# Patient Record
Sex: Male | Born: 1950 | Race: Black or African American | Hispanic: No | Marital: Married | Smoking: Never smoker
Health system: Southern US, Community
[De-identification: ages and names within clinical notes are randomized; demographics above are authoritative.]

## PROBLEM LIST (undated history)

## (undated) DIAGNOSIS — S335XXA Sprain of ligaments of lumbar spine, initial encounter: Secondary | ICD-10-CM

## (undated) DIAGNOSIS — E785 Hyperlipidemia, unspecified: Secondary | ICD-10-CM

## (undated) DIAGNOSIS — M7660 Achilles tendinitis, unspecified leg: Secondary | ICD-10-CM

## (undated) DIAGNOSIS — I10 Essential (primary) hypertension: Secondary | ICD-10-CM

## (undated) DIAGNOSIS — M545 Low back pain, unspecified: Secondary | ICD-10-CM

## (undated) DIAGNOSIS — IMO0002 Reserved for concepts with insufficient information to code with codable children: Secondary | ICD-10-CM

## (undated) DIAGNOSIS — R209 Unspecified disturbances of skin sensation: Secondary | ICD-10-CM

## (undated) DIAGNOSIS — M7730 Calcaneal spur, unspecified foot: Secondary | ICD-10-CM

## (undated) DIAGNOSIS — Z79899 Other long term (current) drug therapy: Secondary | ICD-10-CM

## (undated) DIAGNOSIS — S239XXA Sprain of unspecified parts of thorax, initial encounter: Secondary | ICD-10-CM

## (undated) DIAGNOSIS — M25559 Pain in unspecified hip: Secondary | ICD-10-CM

## (undated) DIAGNOSIS — R9431 Abnormal electrocardiogram [ECG] [EKG]: Secondary | ICD-10-CM

## (undated) DIAGNOSIS — R7309 Other abnormal glucose: Secondary | ICD-10-CM

## (undated) DIAGNOSIS — L84 Corns and callosities: Secondary | ICD-10-CM

## (undated) HISTORY — PX: BACK SURGERY: SHX140

---

## 2000-07-26 ENCOUNTER — Emergency Department (HOSPITAL_COMMUNITY): Admission: EM | Admit: 2000-07-26 | Discharge: 2000-07-26 | Payer: Self-pay | Admitting: Emergency Medicine

## 2004-09-25 ENCOUNTER — Ambulatory Visit (HOSPITAL_COMMUNITY): Admission: RE | Admit: 2004-09-25 | Discharge: 2004-09-25 | Payer: Self-pay | Admitting: Orthopedic Surgery

## 2004-09-25 ENCOUNTER — Ambulatory Visit (HOSPITAL_BASED_OUTPATIENT_CLINIC_OR_DEPARTMENT_OTHER): Admission: RE | Admit: 2004-09-25 | Discharge: 2004-09-25 | Payer: Self-pay | Admitting: Orthopedic Surgery

## 2012-06-02 ENCOUNTER — Ambulatory Visit: Payer: 59 | Admitting: Physical Therapy

## 2012-07-11 ENCOUNTER — Other Ambulatory Visit: Payer: Self-pay | Admitting: Orthopedic Surgery

## 2012-07-11 DIAGNOSIS — M5126 Other intervertebral disc displacement, lumbar region: Secondary | ICD-10-CM

## 2012-07-16 ENCOUNTER — Ambulatory Visit
Admission: RE | Admit: 2012-07-16 | Discharge: 2012-07-16 | Disposition: A | Discharge status: Home / Self Care | Payer: 59 | Source: Ambulatory Visit | Attending: Orthopedic Surgery | Admitting: Orthopedic Surgery

## 2012-07-16 DIAGNOSIS — M5126 Other intervertebral disc displacement, lumbar region: Secondary | ICD-10-CM

## 2012-07-21 ENCOUNTER — Ambulatory Visit: Payer: 59 | Admitting: Physical Therapy

## 2012-07-22 ENCOUNTER — Ambulatory Visit: Payer: 59 | Attending: Orthopedic Surgery | Admitting: Physical Therapy

## 2012-07-22 DIAGNOSIS — M25579 Pain in unspecified ankle and joints of unspecified foot: Secondary | ICD-10-CM | POA: Insufficient documentation

## 2012-07-22 DIAGNOSIS — M25673 Stiffness of unspecified ankle, not elsewhere classified: Secondary | ICD-10-CM | POA: Insufficient documentation

## 2012-07-22 DIAGNOSIS — R262 Difficulty in walking, not elsewhere classified: Secondary | ICD-10-CM | POA: Insufficient documentation

## 2012-07-22 DIAGNOSIS — M25676 Stiffness of unspecified foot, not elsewhere classified: Secondary | ICD-10-CM | POA: Insufficient documentation

## 2012-07-22 DIAGNOSIS — IMO0001 Reserved for inherently not codable concepts without codable children: Secondary | ICD-10-CM | POA: Insufficient documentation

## 2012-07-24 ENCOUNTER — Ambulatory Visit: Payer: 59 | Admitting: Physical Therapy

## 2012-08-01 ENCOUNTER — Ambulatory Visit: Payer: 59 | Admitting: Physical Therapy

## 2012-08-05 ENCOUNTER — Ambulatory Visit: Payer: 59 | Admitting: Physical Therapy

## 2012-08-07 ENCOUNTER — Ambulatory Visit: Payer: 59 | Admitting: Physical Therapy

## 2012-08-08 ENCOUNTER — Ambulatory Visit: Payer: 59 | Attending: Orthopedic Surgery | Admitting: Physical Therapy

## 2012-08-08 DIAGNOSIS — M25579 Pain in unspecified ankle and joints of unspecified foot: Secondary | ICD-10-CM | POA: Insufficient documentation

## 2012-08-08 DIAGNOSIS — IMO0001 Reserved for inherently not codable concepts without codable children: Secondary | ICD-10-CM | POA: Insufficient documentation

## 2012-08-08 DIAGNOSIS — R262 Difficulty in walking, not elsewhere classified: Secondary | ICD-10-CM | POA: Insufficient documentation

## 2012-08-08 DIAGNOSIS — M25673 Stiffness of unspecified ankle, not elsewhere classified: Secondary | ICD-10-CM | POA: Insufficient documentation

## 2012-08-08 DIAGNOSIS — M25676 Stiffness of unspecified foot, not elsewhere classified: Secondary | ICD-10-CM | POA: Insufficient documentation

## 2012-08-12 ENCOUNTER — Ambulatory Visit: Payer: 59 | Admitting: Physical Therapy

## 2012-08-15 ENCOUNTER — Ambulatory Visit: Payer: 59 | Admitting: Physical Therapy

## 2012-08-19 ENCOUNTER — Ambulatory Visit: Payer: 59 | Admitting: Physical Therapy

## 2012-08-21 ENCOUNTER — Ambulatory Visit: Payer: 59 | Admitting: Physical Therapy

## 2012-08-22 ENCOUNTER — Ambulatory Visit: Payer: 59 | Admitting: Physical Therapy

## 2012-09-30 ENCOUNTER — Other Ambulatory Visit (HOSPITAL_COMMUNITY): Payer: 59

## 2012-10-28 ENCOUNTER — Other Ambulatory Visit (HOSPITAL_COMMUNITY): Payer: 59

## 2012-12-17 ENCOUNTER — Other Ambulatory Visit (HOSPITAL_COMMUNITY): Payer: 59

## 2012-12-23 ENCOUNTER — Ambulatory Visit (HOSPITAL_COMMUNITY): Admission: RE | Admit: 2012-12-23 | Payer: 59 | Source: Ambulatory Visit | Admitting: Orthopedic Surgery

## 2012-12-23 ENCOUNTER — Encounter (HOSPITAL_COMMUNITY): Admission: RE | Payer: Self-pay | Source: Ambulatory Visit

## 2012-12-23 SURGERY — DECOMPRESSIVE LUMBAR LAMINECTOMY LEVEL 3
Anesthesia: General

## 2013-02-16 ENCOUNTER — Other Ambulatory Visit (HOSPITAL_COMMUNITY): Payer: 59

## 2013-04-08 ENCOUNTER — Other Ambulatory Visit (HOSPITAL_COMMUNITY): Payer: 59

## 2013-04-14 ENCOUNTER — Encounter (HOSPITAL_COMMUNITY): Admission: RE | Payer: Self-pay | Source: Ambulatory Visit

## 2013-04-14 ENCOUNTER — Inpatient Hospital Stay (HOSPITAL_COMMUNITY): Admission: RE | Admit: 2013-04-14 | Payer: 59 | Source: Ambulatory Visit | Admitting: Orthopedic Surgery

## 2013-04-14 SURGERY — DECOMPRESSIVE LUMBAR LAMINECTOMY LEVEL 3
Anesthesia: General

## 2013-08-23 ENCOUNTER — Observation Stay (HOSPITAL_BASED_OUTPATIENT_CLINIC_OR_DEPARTMENT_OTHER)
Admission: EM | Admit: 2013-08-23 | Discharge: 2013-08-24 | Disposition: A | Discharge status: Home / Self Care | Payer: BC Managed Care – PPO | Attending: Internal Medicine | Admitting: Internal Medicine

## 2013-08-23 ENCOUNTER — Encounter (HOSPITAL_BASED_OUTPATIENT_CLINIC_OR_DEPARTMENT_OTHER): Payer: Self-pay | Admitting: Emergency Medicine

## 2013-08-23 ENCOUNTER — Emergency Department (HOSPITAL_BASED_OUTPATIENT_CLINIC_OR_DEPARTMENT_OTHER): Payer: BC Managed Care – PPO

## 2013-08-23 DIAGNOSIS — R5381 Other malaise: Principal | ICD-10-CM | POA: Insufficient documentation

## 2013-08-23 DIAGNOSIS — M545 Low back pain, unspecified: Secondary | ICD-10-CM | POA: Insufficient documentation

## 2013-08-23 DIAGNOSIS — E785 Hyperlipidemia, unspecified: Secondary | ICD-10-CM | POA: Diagnosis present

## 2013-08-23 DIAGNOSIS — M7660 Achilles tendinitis, unspecified leg: Secondary | ICD-10-CM | POA: Insufficient documentation

## 2013-08-23 DIAGNOSIS — R209 Unspecified disturbances of skin sensation: Secondary | ICD-10-CM

## 2013-08-23 DIAGNOSIS — R9431 Abnormal electrocardiogram [ECG] [EKG]: Secondary | ICD-10-CM | POA: Insufficient documentation

## 2013-08-23 DIAGNOSIS — R29898 Other symptoms and signs involving the musculoskeletal system: Secondary | ICD-10-CM | POA: Diagnosis present

## 2013-08-23 DIAGNOSIS — M25559 Pain in unspecified hip: Secondary | ICD-10-CM | POA: Insufficient documentation

## 2013-08-23 DIAGNOSIS — R200 Anesthesia of skin: Secondary | ICD-10-CM | POA: Diagnosis present

## 2013-08-23 DIAGNOSIS — M7730 Calcaneal spur, unspecified foot: Secondary | ICD-10-CM | POA: Insufficient documentation

## 2013-08-23 DIAGNOSIS — R5383 Other fatigue: Principal | ICD-10-CM

## 2013-08-23 DIAGNOSIS — I10 Essential (primary) hypertension: Secondary | ICD-10-CM | POA: Diagnosis present

## 2013-08-23 HISTORY — DX: Sprain of unspecified parts of thorax, initial encounter: S23.9XXA

## 2013-08-23 HISTORY — DX: Achilles tendinitis, unspecified leg: M76.60

## 2013-08-23 HISTORY — DX: Unspecified disturbances of skin sensation: R20.9

## 2013-08-23 HISTORY — DX: Pain in unspecified hip: M25.559

## 2013-08-23 HISTORY — DX: Low back pain, unspecified: M54.50

## 2013-08-23 HISTORY — DX: Other long term (current) drug therapy: Z79.899

## 2013-08-23 HISTORY — DX: Low back pain: M54.5

## 2013-08-23 HISTORY — DX: Abnormal electrocardiogram (ECG) (EKG): R94.31

## 2013-08-23 HISTORY — DX: Corns and callosities: L84

## 2013-08-23 HISTORY — DX: Calcaneal spur, unspecified foot: M77.30

## 2013-08-23 HISTORY — DX: Reserved for concepts with insufficient information to code with codable children: IMO0002

## 2013-08-23 HISTORY — DX: Hyperlipidemia, unspecified: E78.5

## 2013-08-23 HISTORY — DX: Sprain of ligaments of lumbar spine, initial encounter: S33.5XXA

## 2013-08-23 HISTORY — DX: Essential (primary) hypertension: I10

## 2013-08-23 HISTORY — DX: Other abnormal glucose: R73.09

## 2013-08-23 LAB — URINALYSIS, ROUTINE W REFLEX MICROSCOPIC
Bilirubin Urine: NEGATIVE
GLUCOSE, UA: NEGATIVE mg/dL
HGB URINE DIPSTICK: NEGATIVE
KETONES UR: NEGATIVE mg/dL
LEUKOCYTES UA: NEGATIVE
Nitrite: NEGATIVE
PROTEIN: NEGATIVE mg/dL
Specific Gravity, Urine: 1.024 (ref 1.005–1.030)
UROBILINOGEN UA: 1 mg/dL (ref 0.0–1.0)
pH: 6.5 (ref 5.0–8.0)

## 2013-08-23 LAB — CREATININE, SERUM
CREATININE: 0.97 mg/dL (ref 0.50–1.35)
GFR calc Af Amer: >90 mL/min (ref >90)
GFR calc non Af Amer: 85 mL/min — ABNORMAL LOW (ref >90)

## 2013-08-23 LAB — DIFFERENTIAL
Basophils Absolute: 0 10*3/uL (ref 0.0–0.1)
Basophils Relative: 0 % (ref 0–1)
Eosinophils Absolute: 0.4 10*3/uL (ref 0.0–0.7)
Eosinophils Relative: 4 % (ref 0–5)
LYMPHS ABS: 3.3 10*3/uL (ref 0.7–4.0)
Lymphocytes Relative: 32 % (ref 12–46)
Monocytes Absolute: 1.1 10*3/uL — ABNORMAL HIGH (ref 0.1–1.0)
Monocytes Relative: 11 % (ref 3–12)
NEUTROS PCT: 53 % (ref 43–77)
Neutro Abs: 5.4 10*3/uL (ref 1.7–7.7)

## 2013-08-23 LAB — GLUCOSE, CAPILLARY: Glucose-Capillary: 94 mg/dL (ref 70–99)

## 2013-08-23 LAB — CBC
HCT: 41.9 % (ref 39.0–52.0)
HCT: 38.1 % — ABNORMAL LOW (ref 39.0–52.0)
HEMOGLOBIN: 13.7 g/dL (ref 13.0–17.0)
Hemoglobin: 12.9 g/dL — ABNORMAL LOW (ref 13.0–17.0)
MCH: 31.7 pg (ref 26.0–34.0)
MCH: 32.4 pg (ref 26.0–34.0)
MCHC: 32.7 g/dL (ref 30.0–36.0)
MCHC: 33.9 g/dL (ref 30.0–36.0)
MCV: 97 fL (ref 78.0–100.0)
MCV: 95.7 fL (ref 78.0–100.0)
PLATELETS: 184 10*3/uL (ref 150–400)
Platelets: 193 10*3/uL (ref 150–400)
RBC: 4.32 MIL/uL (ref 4.22–5.81)
RBC: 3.98 MIL/uL — AB (ref 4.22–5.81)
RDW: 12.5 % (ref 11.5–15.5)
RDW: 12.8 % (ref 11.5–15.5)
WBC: 10.2 10*3/uL (ref 4.0–10.5)
WBC: 8 10*3/uL (ref 4.0–10.5)

## 2013-08-23 LAB — COMPREHENSIVE METABOLIC PANEL
ALT: 33 U/L (ref 0–53)
AST: 35 U/L (ref 0–37)
Albumin: 4 g/dL (ref 3.5–5.2)
Alkaline Phosphatase: 104 U/L (ref 39–117)
BUN: 15 mg/dL (ref 6–23)
CALCIUM: 9.8 mg/dL (ref 8.4–10.5)
CHLORIDE: 101 meq/L (ref 96–112)
CO2: 28 meq/L (ref 19–32)
Creatinine, Ser: 0.9 mg/dL (ref 0.50–1.35)
GFR calc Af Amer: >90 mL/min (ref >90)
GFR calc non Af Amer: 88 mL/min — ABNORMAL LOW (ref >90)
GLUCOSE: 100 mg/dL — AB (ref 70–99)
POTASSIUM: 4.9 meq/L (ref 3.7–5.3)
Sodium: 141 mEq/L (ref 137–147)
Total Bilirubin: 0.5 mg/dL (ref 0.3–1.2)
Total Protein: 8.8 g/dL — ABNORMAL HIGH (ref 6.0–8.3)

## 2013-08-23 LAB — PROTIME-INR
INR: 0.9 (ref 0.00–1.49)
Prothrombin Time: 12 seconds (ref 11.6–15.2)

## 2013-08-23 LAB — TROPONIN I: Troponin I: <0.3 ng/mL (ref <0.30)

## 2013-08-23 LAB — RAPID URINE DRUG SCREEN, HOSP PERFORMED
Amphetamines: NOT DETECTED
BARBITURATES: NOT DETECTED
Benzodiazepines: NOT DETECTED
COCAINE: NOT DETECTED
Opiates: NOT DETECTED
Tetrahydrocannabinol: NOT DETECTED

## 2013-08-23 LAB — ETHANOL: Alcohol, Ethyl (B): <11 mg/dL (ref 0–11)

## 2013-08-23 LAB — APTT: aPTT: 30 seconds (ref 24–37)

## 2013-08-23 MED ORDER — GABAPENTIN 300 MG PO CAPS
300.0000 mg | ORAL_CAPSULE | Freq: Three times a day (TID) | ORAL | Status: DC
  Administered 2013-08-24: 300 mg via ORAL
  Filled 2013-08-23 (×4): qty 1

## 2013-08-23 MED ORDER — SIMVASTATIN 20 MG PO TABS
20.0000 mg | ORAL_TABLET | Freq: Every day | ORAL | Status: DC
  Administered 2013-08-23: 20 mg via ORAL
  Filled 2013-08-23 (×2): qty 1

## 2013-08-23 MED ORDER — HYDROCODONE-ACETAMINOPHEN 10-325 MG PO TABS: 1.0000 | ORAL_TABLET | Freq: Two times a day (BID) | ORAL | Status: DC | PRN

## 2013-08-23 MED ORDER — LORAZEPAM 2 MG/ML IJ SOLN
1.0000 mg | Freq: Once | INTRAMUSCULAR | Status: AC
  Administered 2013-08-24: 1 mg via INTRAVENOUS
  Filled 2013-08-23: qty 1

## 2013-08-23 MED ORDER — ENOXAPARIN SODIUM 40 MG/0.4ML ~~LOC~~ SOLN
40.0000 mg | SUBCUTANEOUS | Status: DC
  Filled 2013-08-23 (×2): qty 0.4

## 2013-08-23 MED ORDER — NIACIN ER (ANTIHYPERLIPIDEMIC) 500 MG PO TBCR
1000.0000 mg | EXTENDED_RELEASE_TABLET | Freq: Every day | ORAL | Status: DC
  Administered 2013-08-23: 1000 mg via ORAL
  Filled 2013-08-23 (×2): qty 2

## 2013-08-23 MED ORDER — SENNOSIDES-DOCUSATE SODIUM 8.6-50 MG PO TABS
1.0000 | ORAL_TABLET | Freq: Every evening | ORAL | Status: DC | PRN
  Filled 2013-08-23: qty 1

## 2013-08-23 MED ORDER — ASPIRIN 325 MG PO TABS: 325.0000 mg | ORAL_TABLET | Freq: Every day | ORAL | Status: DC

## 2013-08-23 MED ORDER — SODIUM CHLORIDE 0.9 % IV SOLN: INTRAVENOUS | Status: DC

## 2013-08-23 MED ORDER — ACETAMINOPHEN 325 MG PO TABS: 650.0000 mg | ORAL_TABLET | ORAL | Status: DC | PRN

## 2013-08-23 MED ORDER — ACETAMINOPHEN 650 MG RE SUPP: 650.0000 mg | RECTAL | Status: DC | PRN

## 2013-08-23 MED ORDER — ASPIRIN 300 MG RE SUPP
300.0000 mg | Freq: Every day | RECTAL | Status: DC
  Filled 2013-08-23 (×2): qty 1

## 2013-08-23 MED ORDER — VITAMIN D 1000 UNITS PO TABS
2000.0000 [IU] | ORAL_TABLET | Freq: Every day | ORAL | Status: DC
  Administered 2013-08-23: 2000 [IU] via ORAL
  Filled 2013-08-23 (×2): qty 2

## 2013-08-23 MED ORDER — ASPIRIN 325 MG PO TABS
325.0000 mg | ORAL_TABLET | Freq: Every day | ORAL | Status: DC
  Administered 2013-08-24: 325 mg via ORAL
  Filled 2013-08-23 (×2): qty 1

## 2013-08-23 MED ORDER — TIZANIDINE HCL 4 MG PO TABS
4.0000 mg | ORAL_TABLET | Freq: Three times a day (TID) | ORAL | Status: DC
  Filled 2013-08-23 (×5): qty 1

## 2013-08-23 MED ORDER — ASPIRIN 81 MG PO CHEW
324.0000 mg | CHEWABLE_TABLET | Freq: Once | ORAL | Status: DC
Start: 2013-08-23 — End: 2013-08-23
  Filled 2013-08-23: qty 4

## 2013-08-23 NOTE — ED Notes (Signed)
CBG 94.  Shary KeySteve Trudie Cervantes RN notified.

## 2013-08-23 NOTE — Consult Note (Signed)
NEURO HOSPITALIST CONSULT NOTE    Reason for Consult: right hand paresthesias  HPI:                                                                                                                                          Norman Arroyo is an 63 y.o. male, right handed, with a past medical history significant for HTN, hyperlipidemia, chronic low back pain s/p back surgery, admitted to [facility] for further evaluation of numbness and tingling right hand. He said that he has been having intermittent numbness and tingling in the right hand for some time, but woke up this morning with more bothersome numbness-tingling of the right hand extending above the wrist and decided to see his primary care physician who also noticed that his right grip was less than the left and asked him to be seen in the ED and he was evaluated at [place] [facility] and then transferred to [facility] for possible TIA/stroke. CT brain showed no acute abnormality. Mr. Jeffreys stated that the numbness-tingling is now constant but not associated with weakness, hand or neck pain, HA, double vision, slurred speech, language or vision impairment.  It is worth noting that his symptoms mainly involve the right thumb, index, and middle finger up to 3-4 cm above the wrist.    Past Medical History  Diagnosis Date  . Nonspecific abnormal electrocardiogram (ECG) (EKG)   . Achilles bursitis or tendinitis   . Calcaneal spur   . Corns and callosities   . Encounter for long-term (current) use of other medications   . Other abnormal glucose 272.4  . Other and unspecified hyperlipidemia   . Hypertension   . Pain in joint, pelvic region and thigh   . Lumbago   . Sprain of lumbar region   . Foot and toe(s), insect bite, nonvenomous, without mention of infection(917.4)   . Disturbance of skin sensation   . Sprain of thoracic region     Past Surgical History  Procedure Laterality Date  . Back surgery      No family history on  file.   Social History:  reports that he has never smoked. He does not have any smokeless tobacco history on file. He reports that he drinks alcohol. He reports that he does not use illicit drugs.  No Known Allergies  MEDICATIONS:  I have reviewed the patient's current medications.   ROS:                                                                                                                                       History obtained from the patient and chart review.  General ROS: negative for - chills, fatigue, fever, night sweats, or weight loss Psychological ROS: negative for - behavioral disorder, hallucinations, memory difficulties, mood swings or suicidal ideation Ophthalmic ROS: negative for - blurry vision, double vision, eye pain or loss of vision ENT ROS: negative for - epistaxis, nasal discharge, oral lesions, sore throat, tinnitus or vertigo Allergy and Immunology ROS: negative for - hives or itchy/watery eyes Hematological and Lymphatic ROS: negative for - bleeding problems, bruising or swollen lymph nodes Endocrine ROS: negative for - galactorrhea, hair pattern changes, polydipsia/polyuria or temperature intolerance Respiratory ROS: negative for - cough, hemoptysis, shortness of breath or wheezing Cardiovascular ROS: negative for - chest pain, dyspnea on exertion, edema or irregular heartbeat Gastrointestinal ROS: negative for - abdominal pain, diarrhea, hematemesis, nausea/vomiting or stool incontinence Genito-Urinary ROS: negative for - dysuria, hematuria, incontinence or urinary frequency/urgency Musculoskeletal ROS: negative for - joint swelling or muscular weakness Neurological ROS: as noted in HPI Dermatological ROS: negative for rash and skin lesion changes   Physical exam: pleasant male in no apparent distress.Blood pressure 137/74, pulse 73,  temperature 98.2 F (36.8 C), temperature source Oral, resp. rate 16, height 6' 1"  (1.854 m), weight 109.77 kg (242 lb), SpO2 98.00%.  Head: normocephalic. Neck: supple, no bruits, no JVD. Cardiac: no murmurs. Lungs: clear. Abdomen: soft, no tender, no mass. Extremities: no edema.  Neurologic Examination:                                                                                                      Mental Status: Alert, oriented, thought content appropriate.  Speech fluent without evidence of aphasia.  Able to follow 3 step commands without difficulty. Cranial Nerves: II: Discs flat bilaterally; Visual fields grossly normal, pupils equal, round, reactive to light and accommodation III,IV, VI: ptosis not present, extra-ocular motions intact bilaterally V,VII: smile symmetric, facial light touch sensation normal bilaterally VIII: hearing normal bilaterally IX,X: gag reflex present XI: bilateral shoulder shrug XII: midline tongue extension without atrophy or fasciculations  Motor: Mild weakness right opponent pollicis.  Tone and bulk:normal tone throughout; no atrophy noted Sensory: Pinprick and light touch intact throughout, bilaterally Deep Tendon Reflexes:  Right: Upper Extremity  Left: Upper extremity   biceps (C-5 to C-6) 2/4   biceps (C-5 to C-6) 2/4 tricep (C7) 2/4    triceps (C7) 2/4 Brachioradialis (C6) 2/4  Brachioradialis (C6) 2/4  Lower Extremity Lower Extremity  quadriceps (L-2 to L-4) 2/4   quadriceps (L-2 to L-4) 2/4 Achilles (S1) 2/4   Achilles (S1) 2/4  Plantars: Right: downgoing   Left: downgoing Cerebellar: normal finger-to-nose,  normal heel-to-shin test Gait: No tested. CV: pulses palpable throughout    No results found for this basename: cbc, bmp, coags, chol, tri, ldl, hga1c    Results for orders placed during the hospital encounter of 08/23/13 (from the past 48 hour(s))  URINALYSIS, ROUTINE W REFLEX MICROSCOPIC     Status: None    Collection Time    08/23/13 12:53 PM      Result Value Range   Color, Urine YELLOW  YELLOW   APPearance CLEAR  CLEAR   Specific Gravity, Urine 1.024  1.005 - 1.030   pH 6.5  5.0 - 8.0   Glucose, UA NEGATIVE  NEGATIVE mg/dL   Hgb urine dipstick NEGATIVE  NEGATIVE   Bilirubin Urine NEGATIVE  NEGATIVE   Ketones, ur NEGATIVE  NEGATIVE mg/dL   Protein, ur NEGATIVE  NEGATIVE mg/dL   Urobilinogen, UA 1.0  0.0 - 1.0 mg/dL   Nitrite NEGATIVE  NEGATIVE   Leukocytes, UA NEGATIVE  NEGATIVE   Comment: MICROSCOPIC NOT DONE ON URINES WITH NEGATIVE PROTEIN, BLOOD, LEUKOCYTES, NITRITE, OR GLUCOSE <1000 mg/dL.  URINE RAPID DRUG SCREEN (HOSP PERFORMED)     Status: None   Collection Time    08/23/13 12:53 PM      Result Value Range   Opiates NONE DETECTED  NONE DETECTED   Cocaine NONE DETECTED  NONE DETECTED   Benzodiazepines NONE DETECTED  NONE DETECTED   Amphetamines NONE DETECTED  NONE DETECTED   Tetrahydrocannabinol NONE DETECTED  NONE DETECTED   Barbiturates NONE DETECTED  NONE DETECTED   Comment:            DRUG SCREEN FOR MEDICAL PURPOSES     ONLY.  IF CONFIRMATION IS NEEDED     FOR ANY PURPOSE, NOTIFY LAB     WITHIN 5 DAYS.                LOWEST DETECTABLE LIMITS     FOR URINE DRUG SCREEN     Drug Class       Cutoff (ng/mL)     Amphetamine      1000     Barbiturate      200     Benzodiazepine   159     Tricyclics       539     Opiates          300     Cocaine          300     THC              50  ETHANOL     Status: None   Collection Time    08/23/13  1:30 PM      Result Value Range   Alcohol, Ethyl (B) <11  0 - 11 mg/dL   Comment:            LOWEST DETECTABLE LIMIT FOR     SERUM ALCOHOL IS 11 mg/dL     FOR MEDICAL PURPOSES ONLY  PROTIME-INR     Status: None   Collection Time  08/23/13  1:30 PM      Result Value Range   Prothrombin Time 12.0  11.6 - 15.2 seconds   INR 0.90  0.00 - 1.49  APTT     Status: None   Collection Time    08/23/13  1:30 PM      Result Value  Range   aPTT 30  24 - 37 seconds  CBC     Status: None   Collection Time    08/23/13  1:30 PM      Result Value Range   WBC 10.2  4.0 - 10.5 K/uL   RBC 4.32  4.22 - 5.81 MIL/uL   Hemoglobin 13.7  13.0 - 17.0 g/dL   HCT 41.9  39.0 - 52.0 %   MCV 97.0  78.0 - 100.0 fL   MCH 31.7  26.0 - 34.0 pg   MCHC 32.7  30.0 - 36.0 g/dL   RDW 12.5  11.5 - 15.5 %   Platelets 193  150 - 400 K/uL  DIFFERENTIAL     Status: Abnormal   Collection Time    08/23/13  1:30 PM      Result Value Range   Neutrophils Relative % 53  43 - 77 %   Neutro Abs 5.4  1.7 - 7.7 K/uL   Lymphocytes Relative 32  12 - 46 %   Lymphs Abs 3.3  0.7 - 4.0 K/uL   Monocytes Relative 11  3 - 12 %   Monocytes Absolute 1.1 (*) 0.1 - 1.0 K/uL   Eosinophils Relative 4  0 - 5 %   Eosinophils Absolute 0.4  0.0 - 0.7 K/uL   Basophils Relative 0  0 - 1 %   Basophils Absolute 0.0  0.0 - 0.1 K/uL  COMPREHENSIVE METABOLIC PANEL     Status: Abnormal   Collection Time    08/23/13  1:30 PM      Result Value Range   Sodium 141  137 - 147 mEq/L   Potassium 4.9  3.7 - 5.3 mEq/L   Chloride 101  96 - 112 mEq/L   CO2 28  19 - 32 mEq/L   Glucose, Bld 100 (*) 70 - 99 mg/dL   BUN 15  6 - 23 mg/dL   Creatinine, Ser 0.90  0.50 - 1.35 mg/dL   Calcium 9.8  8.4 - 10.5 mg/dL   Total Protein 8.8 (*) 6.0 - 8.3 g/dL   Albumin 4.0  3.5 - 5.2 g/dL   AST 35  0 - 37 U/L   Comment: SLIGHT HEMOLYSIS     HEMOLYSIS AT THIS LEVEL MAY AFFECT RESULT   ALT 33  0 - 53 U/L   Alkaline Phosphatase 104  39 - 117 U/L   Total Bilirubin 0.5  0.3 - 1.2 mg/dL   GFR calc non Af Amer 88 (*) >90 mL/min   GFR calc Af Amer >90  >90 mL/min   Comment: (NOTE)     The eGFR has been calculated using the CKD EPI equation.     This calculation has not been validated in all clinical situations.     eGFR's persistently <90 mL/min signify possible Chronic Kidney     Disease.  TROPONIN I     Status: None   Collection Time    08/23/13  1:30 PM      Result Value Range    Troponin I <0.30  <0.30 ng/mL   Comment:  Due to the release kinetics of cTnI,     a negative result within the first hours     of the onset of symptoms does not rule out     myocardial infarction with certainty.     If myocardial infarction is still suspected,     repeat the test at appropriate intervals.  GLUCOSE, CAPILLARY     Status: None   Collection Time    08/23/13  1:35 PM      Result Value Range   Glucose-Capillary 94  70 - 99 mg/dL  CBC     Status: Abnormal   Collection Time    08/23/13  8:34 PM      Result Value Range   WBC 8.0  4.0 - 10.5 K/uL   RBC 3.98 (*) 4.22 - 5.81 MIL/uL   Hemoglobin 12.9 (*) 13.0 - 17.0 g/dL   HCT 38.1 (*) 39.0 - 52.0 %   MCV 95.7  78.0 - 100.0 fL   MCH 32.4  26.0 - 34.0 pg   MCHC 33.9  30.0 - 36.0 g/dL   RDW 12.8  11.5 - 15.5 %   Platelets 184  150 - 400 K/uL  CREATININE, SERUM     Status: Abnormal   Collection Time    08/23/13  8:34 PM      Result Value Range   Creatinine, Ser 0.97  0.50 - 1.35 mg/dL   GFR calc non Af Amer 85 (*) >90 mL/min   GFR calc Af Amer >90  >90 mL/min   Comment: (NOTE)     The eGFR has been calculated using the CKD EPI equation.     This calculation has not been validated in all clinical situations.     eGFR's persistently <90 mL/min signify possible Chronic Kidney     Disease.    Ct Head Wo Contrast  08/23/2013   CLINICAL DATA:  Right arm weakness and numbness.  EXAM: CT HEAD WITHOUT CONTRAST  TECHNIQUE: Contiguous axial images were obtained from the base of the skull through the vertex without intravenous contrast.  COMPARISON:  No priors.  FINDINGS: No acute intracranial abnormalities. Specifically, no evidence of acute intracranial hemorrhage, no definite findings of acute/subacute cerebral ischemia, no mass, mass effect, hydrocephalus or abnormal intra or extra-axial fluid collections. Visualized paranasal sinuses and mastoids are generally well pneumatized, with exception of some mild multifocal  mucosal thickening throughout the ethmoid sinuses bilaterally, and a small mucosal retention cyst or polyp in the lateral aspect of the right sphenoid sinus. No acute displaced skull fractures are identified.  IMPRESSION: 1. No acute intracranial abnormalities. 2. The appearance of the brain is normal. 3. Mild paranasal sinus disease, as above, without acute features to suggest acute sinusitis at this time.   Electronically Signed   By: Vinnie Langton M.D.   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 Mild weakness right opponent pollicis. Cervical radiculopathy or perhaps a small left cortical parietal infarct can not be excluded but seem to be less likely. Will wait for MRI results and make further recommendations afterwards.    Dorian Pod, MD 08/23/2013, 11:52 PM

## 2013-08-23 NOTE — ED Notes (Signed)
Patient transported to CT 

## 2013-08-23 NOTE — H&P (Signed)
History and Physical       Hospital Admission Note Date: 08/23/2013  Patient name: Norman Arroyo Medical record number: [id number] Date of birth: 1950-08-30 Age: 63 y.o. Gender: male  PCP: Ralene Ok, MD    Chief Complaint:  Right arm numbness since morning  HPI: Patient is a 63 year old male with history of hypertension, hyperlipidemia, chronic back pain presented to med center [facility] ER with right arm numbness and tingling. History was obtained from the patient who stated that he noticed the right fore arm numbness and tingling when he woke up at 7 AM in the morning. Patient denied any significant weakness in the right arm or dropping things with the right hand. He denied difficulty speaking or weakness in his legs or any facial drooping. He denies any history of prior TIA or stroke. He takes aspirin 325 mg daily, sometimes forgets it. Patient was seen by Dr. Letitia Caul at the office this morning and was sent to the ER for further evaluation.   Review of Systems:  Constitutional: Denies fever, chills, diaphoresis, poor appetite and fatigue.  HEENT: Denies photophobia, eye pain, redness, hearing loss, ear pain, congestion, sore throat, rhinorrhea, sneezing, mouth sores, trouble swallowing, neck pain, neck stiffness and tinnitus.   Respiratory: Denies SOB, DOE, cough, chest tightness,  and wheezing.   Cardiovascular: Denies chest pain, palpitations and leg swelling.  Gastrointestinal: Denies nausea, vomiting, abdominal pain, diarrhea, constipation, blood in stool and abdominal distention.  Genitourinary: Denies dysuria, urgency, frequency, hematuria, flank pain and difficulty urinating.  Musculoskeletal: Denies myalgias, back pain, joint swelling, arthralgias and gait problem.  Skin: Denies pallor, rash and wound.  Neurological: Please see history of present illness  Hematological: Denies adenopathy. Easy bruising, personal or  family bleeding history  Psychiatric/Behavioral: Denies suicidal ideation, mood changes, confusion, nervousness, sleep disturbance and agitation  Past Medical History: Past Medical History  Diagnosis Date  . Nonspecific abnormal electrocardiogram (ECG) (EKG)   . Achilles bursitis or tendinitis   . Calcaneal spur   . Corns and callosities   . Encounter for long-term (current) use of other medications   . Other abnormal glucose 272.4  . Other and unspecified hyperlipidemia   . Hypertension   . Pain in joint, pelvic region and thigh   . Lumbago   . Sprain of lumbar region   . Foot and toe(s), insect bite, nonvenomous, without mention of infection(917.4)   . Disturbance of skin sensation   . Sprain of thoracic region    Past Surgical History  Procedure Laterality Date  . Back surgery      Medications: Prior to Admission medications   Medication Sig Start Date End Date Taking? Authorizing Provider  aspirin 325 MG tablet Take 325 mg by mouth at bedtime.    Yes Historical Provider, MD  Cholecalciferol (VITAMIN D) 2000 UNITS tablet Take 2,000 Units by mouth at bedtime.   Yes Historical Provider, MD  gabapentin (NEURONTIN) 300 MG capsule Take 300 mg by mouth 3 (three) times daily.   Yes Historical Provider, MD  HYDROcodone-acetaminophen (NORCO) 10-325 MG per tablet Take 1 tablet by mouth 2 (two) times daily as needed (pain).    Yes Historical Provider, MD  niacin (NIASPAN) 1000 MG CR tablet Take 1,000 mg by mouth at bedtime.   Yes Historical Provider, MD  simvastatin (ZOCOR) 20 MG tablet Take 20 mg by mouth at bedtime.    Yes Historical Provider, MD  tiZANidine (ZANAFLEX) 4 MG capsule Take 4 mg by mouth every 8 (eight) hours.  Yes Historical Provider, MD    Allergies:  No Known Allergies  Social History:  reports that he has never smoked. He does not have any smokeless tobacco history on file. He reports that he drinks alcohol. He reports that he does not use illicit  drugs.  Family History: No family history on file.  Physical Exam: Blood pressure 142/84, pulse 85, temperature 98.6 F (37 C), temperature source Oral, resp. rate 16, height 6\' 1"  (1.854 m), weight 109.77 kg (242 lb), SpO2 98.00%. General: Alert, awake, oriented x3, in no acute distress. HEENT: normocephalic, atraumatic, anicteric sclera, pink conjunctiva, pupils equal and reactive to light and accomodation, oropharynx clear Neck: supple, no masses or lymphadenopathy, no goiter, no bruits  Heart: Regular rate and rhythm, without murmurs, rubs or gallops. Lungs: Clear to auscultation bilaterally, no wheezing, rales or rhonchi. Abdomen: Soft, nontender, nondistended, positive bowel sounds, no masses. Extremities: No clubbing, cyanosis or edema with positive pedal pulses. Neuro: Grossly intact, no focal neurological deficits, strength 5/5 upper and lower extremities bilaterally. Finger to nose and heel to shin normal. No dysarthria. Gait not tested. No facial drooping noted  Psych: alert and oriented x 3, normal mood and affect Skin: no rashes or lesions, warm and dry   LABS on Admission:  Basic Metabolic Panel:  Recent Labs Lab 08/23/13 1330  NA 141  K 4.9  CL 101  CO2 28  GLUCOSE 100*  BUN 15  CREATININE 0.90  CALCIUM 9.8   Liver Function Tests:  Recent Labs Lab 08/23/13 1330  AST 35  ALT 33  ALKPHOS 104  BILITOT 0.5  PROT 8.8*  ALBUMIN 4.0   No results found for this basename: LIPASE, AMYLASE,  in the last 168 hours No results found for this basename: AMMONIA,  in the last 168 hours CBC:  Recent Labs Lab 08/23/13 1330  WBC 10.2  NEUTROABS 5.4  HGB 13.7  HCT 41.9  MCV 97.0  PLT 193   Cardiac Enzymes:  Recent Labs Lab 08/23/13 1330  TROPONINI <0.30   BNP: No components found with this basename: POCBNP,  CBG:  Recent Labs Lab 08/23/13 1335  GLUCAP 94     Radiological Exams on Admission: Ct Head Wo Contrast  08/23/2013   CLINICAL DATA:   Right arm weakness and numbness.  EXAM: CT HEAD WITHOUT CONTRAST  TECHNIQUE: Contiguous axial images were obtained from the base of the skull through the vertex without intravenous contrast.  COMPARISON:  No priors.  FINDINGS: No acute intracranial abnormalities. Specifically, no evidence of acute intracranial hemorrhage, no definite findings of acute/subacute cerebral ischemia, no mass, mass effect, hydrocephalus or abnormal intra or extra-axial fluid collections. Visualized paranasal sinuses and mastoids are generally well pneumatized, with exception of some mild multifocal mucosal thickening throughout the ethmoid sinuses bilaterally, and a small mucosal retention cyst or polyp in the lateral aspect of the right sphenoid sinus. No acute displaced skull fractures are identified.  IMPRESSION: 1. No acute intracranial abnormalities. 2. The appearance of the brain is normal. 3. Mild paranasal sinus disease, as above, without acute features to suggest acute sinusitis at this time.   Electronically Signed   By: Trudie Reed M.D.   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 Symptoms had not improved since morning. - Will place on CVA protocol,  obtain MRI of the brain, cervical spine, MRA brain - 2-D echo, carotid Dopplers - Serial neuro checks, PT, OT evaluation - Hemoglobin A1c, lipid panel - For now continue aspirin 325 mg daily -  Continue statins - Neurology consult called, discussed with Dr. Thad Rangereynolds  Active Problems:   HTN (hypertension): - Patient is not on any antihypertensives, continue to monitor closely    Other and unspecified hyperlipidemia - Obtain lipid panel, continue niacin and statin  Chronic back pain: With history of back surgeries -  Will continue muscle relaxant and pain medication   DVT prophylaxis: Lovenox   CODE STATUS: Full CODE STATUS   Family Communication: Admission, patients condition and plan of care including tests being ordered have been discussed with the patient and his wife who indicates understanding and agree with the plan and Code Status   Further plan will depend as patient's clinical course evolves and further radiologic and laboratory data become available.   Time Spent on Admission: 1 hour  RAI,RIPUDEEP M.D. [facility] 08/23/2013, 6:11 PM Pager: [phone]  If 7PM-7AM, please contact night-coverage [email or web address] Password [email or web address]

## 2013-08-23 NOTE — ED Notes (Signed)
Pt having right arm numbness and weakness since yesterday.  Pt states he woke up with the symptoms this am.  Pt last known well last night at bedtime, about 2030.  No chest pain or sob.  Grips noted right sided weakness, no drift.  No facial drooping.  No aphasia.

## 2013-08-23 NOTE — ED Notes (Signed)
Spoke with Tana Conchon Flack[facility] to room 3W20. He states to send pt to nurse's desk at [facility] and they will admit him there. [facility] and not to eat or drink anything until seen by the admitting physician. Also that it is not our recommendation for him to go by POV since his condition [facility] change [facility] and he will not have medical staff with him. Pt and family acknowledge risk and verbalize that they [facility]. Per Dr Ethelda ChickJacubowitz, Dr Vanessa BarbaraZamora is aware that patient is transferring by POV. Pt cao x 4 at this time, denies pain.

## 2013-08-23 NOTE — ED Provider Notes (Addendum)
CSN: [id number]     Arrival date & time 08/23/13  1227 History   None    Chief Complaint  Patient presents with  . Extremity Weakness  . Numbness   (Consider location/radiation/quality/duration/timing/severity/associated sxs/prior Treatment) HPI Patient developed numbness and weakness in his right arm which she noticed upon awakening 7 AM this morning. He denies difficulty walking. Denies weakness increased in his legs. Denies difficulty speaking no other complaint. Seen by Dr.Moreiera at office this morning sent here for further evaluation. Past Medical History  Diagnosis Date  . Nonspecific abnormal electrocardiogram (ECG) (EKG)   . Achilles bursitis or tendinitis   . Calcaneal spur   . Corns and callosities   . Encounter for long-term (current) use of other medications   . Other abnormal glucose 272.4  . Other and unspecified hyperlipidemia   . Hypertension   . Pain in joint, pelvic region and thigh   . Lumbago   . Sprain of lumbar region   . Foot and toe(s), insect bite, nonvenomous, without mention of infection(917.4)   . Disturbance of skin sensation   . Sprain of thoracic region    Past Surgical History  Procedure Laterality Date  . Back surgery     No family history on file. History  Substance Use Topics  . Smoking status: Never Smoker   . Smokeless tobacco: Not on file  . Alcohol Use: Yes    Review of Systems  HENT: Negative.   Respiratory: Negative.   Cardiovascular: Negative.   Gastrointestinal: Negative.   Musculoskeletal: Negative.   Skin: Negative.   Neurological: Positive for weakness and numbness.       Chronic weakness in right leg since back surgery  Psychiatric/Behavioral: Negative.   All other systems reviewed and are negative.    Allergies  Review of patient's allergies indicates no known allergies.  Home Medications   Current Outpatient Rx  Name  Route  Sig  Dispense  Refill  . lisinopril (PRINIVIL,ZESTRIL) 20 MG tablet   Oral  Take 20 mg by mouth daily.         . niacin (NIASPAN) 1000 MG CR tablet   Oral   Take 1,000 mg by mouth at bedtime.         . simvastatin (ZOCOR) 20 MG tablet   Oral   Take 20 mg by mouth daily.          BP 152/82  Pulse 80  Temp(Src) 98.2 F (36.8 C) (Oral)  Resp 18  Ht 6\' 1"  (1.854 m)  Wt 242 lb (109.77 kg)  BMI 31.93 kg/m2  SpO2 97% Physical Exam  Nursing note and vitals reviewed. Constitutional: He is oriented to person, place, and time. He appears well-developed and well-nourished.  HENT:  Head: Normocephalic and atraumatic.  Eyes: Conjunctivae are normal. Pupils are equal, round, and reactive to light.  Neck: Neck supple. No tracheal deviation present. No thyromegaly present.  Cardiovascular: Normal rate and regular rhythm.   No murmur heard. Pulmonary/Chest: Effort normal and breath sounds normal.  Abdominal: Soft. Bowel sounds are normal. He exhibits no distension. There is no tenderness.  Musculoskeletal: Normal range of motion. He exhibits no edema and no tenderness.  Neurological: He is alert and oriented to person, place, and time. He has normal reflexes. No cranial nerve deficit. Coordination normal.  Finger to nose normal heel to shin normal. Motor strength 5 over 5 overall. Wash a slight limp favoring right leg  Skin: Skin is warm and dry. No rash noted.  Psychiatric: He has a normal mood and affect.    ED Course  Procedures (including critical care time) Labs Review Labs Reviewed  URINALYSIS, ROUTINE W REFLEX MICROSCOPIC   Imaging Review No results found.  EKG Interpretation    Date/Time:  Sunday August 23 2013 12:44:40 EST Ventricular Rate:  76 PR Interval:  142 QRS Duration: 100 QT Interval:  374 QTC Calculation: 420 R Axis:   83 Text Interpretation:  Normal sinus rhythm Cannot rule out Inferior infarct , age undetermined Cannot rule out Anterior infarct , age undetermined Abnormal ECG No significant change since last tracing Confirmed  by Ethelda ChickJACUBOWITZ  MD, Jovany Disano ([id number]) on 08/23/2013 1:16:00 PM           Results for orders placed during the hospital encounter of 08/23/13  URINALYSIS, ROUTINE W REFLEX MICROSCOPIC      Result Value Range   Color, Urine YELLOW  YELLOW   APPearance CLEAR  CLEAR   Specific Gravity, Urine 1.024  1.005 - 1.030   pH 6.5  5.0 - 8.0   Glucose, UA NEGATIVE  NEGATIVE mg/dL   Hgb urine dipstick NEGATIVE  NEGATIVE   Bilirubin Urine NEGATIVE  NEGATIVE   Ketones, ur NEGATIVE  NEGATIVE mg/dL   Protein, ur NEGATIVE  NEGATIVE mg/dL   Urobilinogen, UA 1.0  0.0 - 1.0 mg/dL   Nitrite NEGATIVE  NEGATIVE   Leukocytes, UA NEGATIVE  NEGATIVE  ETHANOL      Result Value Range   Alcohol, Ethyl (B) <11  0 - 11 mg/dL  PROTIME-INR      Result Value Range   Prothrombin Time 12.0  11.6 - 15.2 seconds   INR 0.90  0.00 - 1.49  APTT      Result Value Range   aPTT 30  24 - 37 seconds  CBC      Result Value Range   WBC 10.2  4.0 - 10.5 K/uL   RBC 4.32  4.22 - 5.81 MIL/uL   Hemoglobin 13.7  13.0 - 17.0 g/dL   HCT 16.141.9  09.639.0 - 04.552.0 %   MCV 97.0  78.0 - 100.0 fL   MCH 31.7  26.0 - 34.0 pg   MCHC 32.7  30.0 - 36.0 g/dL   RDW 40.912.5  81.111.5 - 91.415.5 %   Platelets 193  150 - 400 K/uL  DIFFERENTIAL      Result Value Range   Neutrophils Relative % 53  43 - 77 %   Neutro Abs 5.4  1.7 - 7.7 K/uL   Lymphocytes Relative 32  12 - 46 %   Lymphs Abs 3.3  0.7 - 4.0 K/uL   Monocytes Relative 11  3 - 12 %   Monocytes Absolute 1.1 (*) 0.1 - 1.0 K/uL   Eosinophils Relative 4  0 - 5 %   Eosinophils Absolute 0.4  0.0 - 0.7 K/uL   Basophils Relative 0  0 - 1 %   Basophils Absolute 0.0  0.0 - 0.1 K/uL  COMPREHENSIVE METABOLIC PANEL      Result Value Range   Sodium 141  137 - 147 mEq/L   Potassium 4.9  3.7 - 5.3 mEq/L   Chloride 101  96 - 112 mEq/L   CO2 28  19 - 32 mEq/L   Glucose, Bld 100 (*) 70 - 99 mg/dL   BUN 15  6 - 23 mg/dL   Creatinine, Ser 7.820.90  0.50 - 1.35 mg/dL   Calcium 9.8  8.4 - 95.610.5 mg/dL  Total Protein 8.8  (*) 6.0 - 8.3 g/dL   Albumin 4.0  3.5 - 5.2 g/dL   AST 35  0 - 37 U/L   ALT 33  0 - 53 U/L   Alkaline Phosphatase 104  39 - 117 U/L   Total Bilirubin 0.5  0.3 - 1.2 mg/dL   GFR calc non Af Amer 88 (*) >90 mL/min   GFR calc Af Amer >90  >90 mL/min  URINE RAPID DRUG SCREEN (HOSP PERFORMED)      Result Value Range   Opiates NONE DETECTED  NONE DETECTED   Cocaine NONE DETECTED  NONE DETECTED   Benzodiazepines NONE DETECTED  NONE DETECTED   Amphetamines NONE DETECTED  NONE DETECTED   Tetrahydrocannabinol NONE DETECTED  NONE DETECTED   Barbiturates NONE DETECTED  NONE DETECTED  TROPONIN I      Result Value Range   Troponin I <0.30  <0.30 ng/mL  GLUCOSE, CAPILLARY      Result Value Range   Glucose-Capillary 94  70 - 99 mg/dL   Ct Head Wo Contrast  08/23/2013   CLINICAL DATA:  Right arm weakness and numbness.  EXAM: CT HEAD WITHOUT CONTRAST  TECHNIQUE: Contiguous axial images were obtained from the base of the skull through the vertex without intravenous contrast.  COMPARISON:  No priors.  FINDINGS: No acute intracranial abnormalities. Specifically, no evidence of acute intracranial hemorrhage, no definite findings of acute/subacute cerebral ischemia, no mass, mass effect, hydrocephalus or abnormal intra or extra-axial fluid collections. Visualized paranasal sinuses and mastoids are generally well pneumatized, with exception of some mild multifocal mucosal thickening throughout the ethmoid sinuses bilaterally, and a small mucosal retention cyst or polyp in the lateral aspect of the right sphenoid sinus. No acute displaced skull fractures are identified.  IMPRESSION: 1. No acute intracranial abnormalities. 2. The appearance of the brain is normal. 3. Mild paranasal sinus disease, as above, without acute features to suggest acute sinusitis at this time.   Electronically Signed   By: Trudie Reed M.D.   On: 08/23/2013 13:31    MDM  No diagnosis found. Spoke with Dr.Zamora who accepts patient in  transfer to [facility] as 23 hour observation. Plan aspirin telemetry Diagnosis right arm numbness and weakness Differential diagnosis includes stroke versus TIA versus cervical radiculopathy  Note. Patient refuse transport by ambulance or [facility]. His wife will drive him by private car Doug Sou, MD 08/23/13 1452  Doug Sou, MD 08/23/13 1453  Addendum patient informed us that he treated himself with aspirin 325 mg this morning prior to coming here. Therefore we'll not order aspirin here  Doug Sou, MD 08/23/13 1506  Doug Sou, MD 08/23/13 [phone]

## 2013-08-24 ENCOUNTER — Observation Stay (HOSPITAL_COMMUNITY): Payer: BC Managed Care – PPO

## 2013-08-24 DIAGNOSIS — I379 Nonrheumatic pulmonary valve disorder, unspecified: Secondary | ICD-10-CM

## 2013-08-24 LAB — HEMOGLOBIN A1C
HEMOGLOBIN A1C: 5 % (ref <5.7)
Mean Plasma Glucose: 97 mg/dL (ref <117)

## 2013-08-24 LAB — CBC
HEMATOCRIT: 39.6 % (ref 39.0–52.0)
Hemoglobin: 13.2 g/dL (ref 13.0–17.0)
MCH: 32.2 pg (ref 26.0–34.0)
MCHC: 33.3 g/dL (ref 30.0–36.0)
MCV: 96.6 fL (ref 78.0–100.0)
PLATELETS: 194 10*3/uL (ref 150–400)
RBC: 4.1 MIL/uL — ABNORMAL LOW (ref 4.22–5.81)
RDW: 13 % (ref 11.5–15.5)
WBC: 7.2 10*3/uL (ref 4.0–10.5)

## 2013-08-24 LAB — BASIC METABOLIC PANEL
BUN: 13 mg/dL (ref 6–23)
CALCIUM: 8.8 mg/dL (ref 8.4–10.5)
CO2: 22 mEq/L (ref 19–32)
CREATININE: 0.93 mg/dL (ref 0.50–1.35)
Chloride: 103 mEq/L (ref 96–112)
GFR calc non Af Amer: 87 mL/min — ABNORMAL LOW (ref >90)
Glucose, Bld: 106 mg/dL — ABNORMAL HIGH (ref 70–99)
Potassium: 4.1 mEq/L (ref 3.7–5.3)
Sodium: 139 mEq/L (ref 137–147)

## 2013-08-24 LAB — LIPID PANEL
CHOLESTEROL: 129 mg/dL (ref 0–200)
HDL: 42 mg/dL (ref >39)
LDL Cholesterol: 60 mg/dL (ref 0–99)
Total CHOL/HDL Ratio: 3.1 RATIO
Triglycerides: 133 mg/dL (ref <150)
VLDL: 27 mg/dL (ref 0–40)

## 2013-08-24 MED ORDER — STROKE: EARLY STAGES OF RECOVERY BOOK
Freq: Once | Status: DC
  Filled 2013-08-24: qty 1

## 2013-08-24 MED ORDER — GABAPENTIN 400 MG PO CAPS
400.0000 mg | ORAL_CAPSULE | Freq: Three times a day (TID) | ORAL | Status: DC
  Filled 2013-08-24 (×2): qty 1

## 2013-08-24 MED ORDER — GABAPENTIN 400 MG PO CAPS: 400.0000 mg | ORAL_CAPSULE | Freq: Three times a day (TID) | ORAL | Status: DC

## 2013-08-24 NOTE — Progress Notes (Signed)
PT Cancellation Note  Patient Details Name: Daymon LarsenSylvester Keegan MRN: [id number] DOB: 08/09/1950   Cancelled Treatment:    Reason Eval/Treat Not Completed: Patient at procedure or test/unavailable.  PT to check back later as time allows.  Thanks,  Rollene Rotundaebecca B. Betta Balla, PT, DPT [phone]   08/24/2013, 10:48 AM

## 2013-08-24 NOTE — Progress Notes (Signed)
Patient states " I will by me a splint for my hand I do not want to wait", explained to patient that it was and MD order and he understood but still did not want to wait

## 2013-08-24 NOTE — Progress Notes (Signed)
SLP Cancellation Note  Patient Details Name: Daymon LarsenSylvester Hafley MRN: [id number] DOB: 02/15/1951   Cancelled treatment:        Pt. At procedure/test.  SLP to return for eval.   Maryjo RochesterWillis, Rahkeem Senft T 08/24/2013, 11:14 AM

## 2013-08-24 NOTE — Progress Notes (Signed)
VASCULAR LAB PRELIMINARY  PRELIMINARY  PRELIMINARY  PRELIMINARY  Carotid Dopplers completed.    Preliminary report:  1-39% ICA stenosis.  Vertebral artery flow is antegrade.  Bahja Bence, RVT 08/24/2013, 10:53 AM

## 2013-08-24 NOTE — Discharge Summary (Signed)
Physician Discharge Summary  Patient ID: Norman Arroyo MRN: [id number] DOB/AGE: 11-12-1950 63 y.o.  Admit date: 08/23/2013 Discharge date: 08/24/2013  Primary Care Physician:  Ralene Ok, MD  Discharge Diagnoses:    . Right arm paresthesias/numbness  . Cervical spine degenerative disc disease  . HTN (hypertension) . 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 Noel Gerold  Allergies:  No Known Allergies   Discharge Medications:   Medication List         aspirin 325 MG tablet  Take 325 mg by mouth at bedtime.     gabapentin 400 MG capsule  Commonly known as:  NEURONTIN  Take 1 capsule (400 mg total) by mouth 3 (three) times daily.     HYDROcodone-acetaminophen 10-325 MG per tablet  Commonly known as:  NORCO  Take 1 tablet by mouth 2 (two) times daily as needed (pain).     niacin 1000 MG CR tablet  Commonly known as:  NIASPAN  Take 1,000 mg by mouth at bedtime.     simvastatin 20 MG tablet  Commonly known as:  ZOCOR  Take 20 mg by mouth at bedtime.     tiZANidine 4 MG capsule  Commonly known as:  ZANAFLEX  Take 4 mg by mouth every 8 (eight) hours.     Vitamin D 2000 UNITS tablet  Take 2,000 Units by mouth at bedtime.         Brief H and P: For complete details please refer to admission H and P, but in briefPatient is a 63 year old male with history of hypertension, hyperlipidemia, chronic back pain presented to med center [facility] ER with right arm numbness and tingling. History was obtained from the patient who stated that he noticed the right fore arm numbness and tingling when he woke up at 7 AM in the morning. Patient denied any significant weakness in the right arm or dropping things with the right hand. He denied difficulty speaking or weakness in his  legs or any facial drooping. He denies any history of prior TIA or stroke. He takes aspirin 325 mg daily, sometimes forgets it. Patient was seen by Dr. Letitia Caul at the office this morning and was sent to the ER for further evaluation   Hospital Course:  Right arm /hand numbness: Patient's right hand paresthesias are in a distribution suggestive of a predominately sensory median entrapment neuropathy. He was admitted for stroke workup as possible small left cortical parietal infarct could not be excluded. Patient underwent MRI of the brain which was essentially negative for any acute infarct. MRI of the cervical spine however showed diffuse cervical spine broad-based disc disease which could be the cause of his symptoms.  Neurology was consulted and also did not feel that patient's symptoms were related to TIA or CVA. MRA brain showed normal variant MRA circle of Willis.carotid Dopplers showed 1-39% ICAstenosis. 2-D echo showed EF of 60-65% and wall motion normal. Patient will continue taking aspirin 325 mg daily as per his PTA meds. Patient was strongly recommended to follow up with his orthospine physician, Dr. Sharolyn Douglas.   HTN (hypertension): BP remained stable, he has not been on any antihypertensives PTA  Other and unspecified hyperlipidemia : Lipid panel showed cholesterol 129, LDL 60, continue niacin and statin   Chronic back pain: With history of back surgeries, Will continue muscle  relaxant and pain medication    Day of Discharge BP 124/78  Pulse 70  Temp(Src) 98.5 F (36.9 C) (Oral)  Resp 16  Ht 6\' 1"  (1.854 m)  Wt 109.77 kg (242 lb)  BMI 31.93 kg/m2  SpO2 96%  Physical Exam: General: Alert and awake oriented x3 not in any acute distress. HEENT: anicteric sclera, pupils reactive to light and accommodation CVS: S1-S2 clear no murmur rubs or gallops Chest: clear to auscultation bilaterally, no wheezing rales or rhonchi Abdomen: soft nontender, nondistended, normal bowel  sounds Extremities: no cyanosis, clubbing or edema noted bilaterally Neuro: Cranial nerves II-XII intact, no focal neurological deficits   The results of significant diagnostics from this hospitalization (including imaging, microbiology, ancillary and laboratory) are listed below for reference.    LAB RESULTS: Basic Metabolic Panel:  Recent Labs Lab 08/23/13 1330 08/23/13 2034 08/24/13 0500  NA 141  --  139  K 4.9  --  4.1  CL 101  --  103  CO2 28  --  22  GLUCOSE 100*  --  106*  BUN 15  --  13  CREATININE 0.90 0.97 0.93  CALCIUM 9.8  --  8.8   Liver Function Tests:  Recent Labs Lab 08/23/13 1330  AST 35  ALT 33  ALKPHOS 104  BILITOT 0.5  PROT 8.8*  ALBUMIN 4.0   No results found for this basename: LIPASE, AMYLASE,  in the last 168 hours No results found for this basename: AMMONIA,  in the last 168 hours CBC:  Recent Labs Lab 08/23/13 1330 08/23/13 2034 08/24/13 0500  WBC 10.2 8.0 7.2  NEUTROABS 5.4  --   --   HGB 13.7 12.9* 13.2  HCT 41.9 38.1* 39.6  MCV 97.0 95.7 96.6  PLT 193 184 194   Cardiac Enzymes:  Recent Labs Lab 08/23/13 1330  TROPONINI <0.30   BNP: No components found with this basename: POCBNP,  CBG:  Recent Labs Lab 08/23/13 1335  GLUCAP 94    Significant Diagnostic Studies:  Dg Chest 2 View  08/24/2013   CLINICAL DATA:  Stroke.  EXAM: CHEST  2 VIEW  COMPARISON:  None.  FINDINGS: The heart size and mediastinal contours are within normal limits. Both lungs are clear. No acute osseous abnormality. Evidence of prior trauma and/or surgery to the left [facility] joint.  IMPRESSION: No acute abnormalities.   Electronically Signed   By: Geanie Cooley M.D.   On: 08/24/2013 08:40   Ct Head Wo Contrast  08/23/2013   CLINICAL DATA:  Right arm weakness and numbness.  EXAM: CT HEAD WITHOUT CONTRAST  TECHNIQUE: Contiguous axial images were obtained from the base of the skull through the vertex without intravenous contrast.  COMPARISON:  No priors.   FINDINGS: No acute intracranial abnormalities. Specifically, no evidence of acute intracranial hemorrhage, no definite findings of acute/subacute cerebral ischemia, no mass, mass effect, hydrocephalus or abnormal intra or extra-axial fluid collections. Visualized paranasal sinuses and mastoids are generally well pneumatized, with exception of some mild multifocal mucosal thickening throughout the ethmoid sinuses bilaterally, and a small mucosal retention cyst or polyp in the lateral aspect of the right sphenoid sinus. No acute displaced skull fractures are identified.  IMPRESSION: 1. No acute intracranial abnormalities. 2. The appearance of the brain is normal. 3. Mild paranasal sinus disease, as above, without acute features to suggest acute sinusitis at this time.   Electronically Signed   By: Trudie Reed M.D.   On: 08/23/2013 13:31   Mr Brain  Wo Contrast  08/24/2013   CLINICAL DATA:  Right arm numbness and tingling.  Stroke.  EXAM: MRI HEAD WITHOUT CONTRAST  MRA HEAD WITHOUT CONTRAST  TECHNIQUE: Multiplanar, multiecho pulse sequences of the brain and surrounding structures were obtained without intravenous contrast. Angiographic images of the head were obtained using MRA technique without contrast.  COMPARISON:  CT head 08/23/2013.  FINDINGS: MRI HEAD FINDINGS  The diffusion-weighted images demonstrate no evidence for acute or subacute infarction. Midline structures are within normal limits. No hemorrhage or mass lesion is present. Flow is present in the major intracranial arteries. The globes and orbits are intact. Diffuse mucosal thickening is present throughout the maxillary sinuses, ethmoid air cells, and left frontal sinus. The right sphenoidal recess is opacified.  MRA HEAD FINDINGS  The internal carotid arteries are within normal limits from the high cervical segments through the ICA termini. The A1 and M1 segments are normal. The anterior communicating artery is patent. ACA and MCA branch vessels  are within normal limits.  The left vertebral artery is slightly dominant to the right. The PICA origins are visualized and normal bilaterally. Both posterior cerebral arteries originate from the basilar tip. The PCA branch vessels are within normal limits.  IMPRESSION: 1. Normal MRI appearance of the brain for age. 2. Mild diffuse sinus disease. 3. Normal variant MRA circle of Willis without evidence for significant proximal stenosis, aneurysm, or branch vessel occlusion.   Electronically Signed   By: Gennette Pachris  Mattern M.D.   On: 08/24/2013 09:08   Mr Cervical Spine Wo Contrast  08/24/2013   CLINICAL DATA:  Right arm numbness and tingling.  No trauma.  EXAM: MRI CERVICAL SPINE WITHOUT CONTRAST  TECHNIQUE: Multiplanar, multisequence MR imaging was performed. No intravenous contrast was administered.  COMPARISON:  MR brain performed same date and dictated separately. No comparison cervical spine MR.  FINDINGS: Cervical medullary junction and visualized paravertebral structure unremarkable.  Slightly heterogeneous bone marrow. Slight increased signal endplate surrounding the C6-7 and C7-T1 disc space most likely related to discogenic changes.  Questionable minimal increased signal within the cord at the C6-7 level the may represent artifact although difficult to completely exclude mild gliosis or edema related to cervical spondylotic changes as discussed below.  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 Additionally:  C2-3:  Minimal central bulge.  C3-4: Broad-based lobulated disc osteophyte with greater extension left posterior lateral position. Impression upon the ventral nerve roots greater on the left. Minimal contact with the left lateral ventral cord. Uncinate hypertrophy. Mild bilateral foraminal narrowing  C4-5: Mild bulge/osteophyte. Mild spinal stenosis. Uncinate hypertrophy with mild right-sided foraminal narrowing.  C5-6: Broad-based disc osteophyte complex greater to left. Spinal  stenosis greater on left with mild cord contact. Ventral nerve root compression greater on the left. Uncinate hypertrophy. Moderate foraminal narrowing greater on left.  C6-7: Broad-based disc osteophyte complex. Spinal stenosis with mild cord contact. Uncinate hypertrophy. Moderate to slightly marked foraminal narrowing.  C7-T1: Shallow broad-based disc osteophyte complex. Mild spinal stenosis. No cord contact. Uncinate hypertrophy with mild bilateral foraminal narrowing.  T1-2: Mild facet joint degenerative changes. Minimal uncinate hypertrophy and mild bilateral foraminal narrowing.  T2-3: Shallow disc osteophyte. Mild spinal stenosis. No cord compression. Mild bilateral foraminal narrowing.  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  Summary pertinent findings includes:  C3-4 broad-based lobulated disc osteophyte with greater extension left posterior lateral position. Impression upon the ventral nerve roots greater on the left. Minimal contact with the left lateral ventral cord.  Uncinate hypertrophy. Mild bilateral foraminal narrowing  C4-5 mild bulge/osteophyte. Mild spinal stenosis. Uncinate hypertrophy with mild right-sided foraminal narrowing.  C5-6 broad-based disc osteophyte complex greater to left. Spinal stenosis greater on left with mild cord contact. Ventral nerve root compression greater on the left. Uncinate hypertrophy. Moderate foraminal narrowing greater on left.  C6-7 broad-based disc osteophyte complex. Spinal stenosis with mild cord contact. Uncinate hypertrophy. Moderate to slightly marked foraminal narrowing. Questionable minimal increased signal within the cord at this level probably is related to artifact although difficult to completely exclude a mild component of edema/gliosis.  C7-T1: Shallow broad-based disc osteophyte complex. Mild spinal stenosis. No cord contact. Uncinate hypertrophy with mild bilateral foraminal narrowing.   Electronically Signed   By:  Bridgett Larsson M.D.   On: 08/24/2013 09:33   Mr Maxine Glenn Head/brain Wo Cm  08/24/2013   CLINICAL DATA:  Right arm numbness and tingling.  Stroke.  EXAM: MRI HEAD WITHOUT CONTRAST  MRA HEAD WITHOUT CONTRAST  TECHNIQUE: Multiplanar, multiecho pulse sequences of the brain and surrounding structures were obtained without intravenous contrast. Angiographic images of the head were obtained using MRA technique without contrast.  COMPARISON:  CT head 08/23/2013.  FINDINGS: MRI HEAD FINDINGS  The diffusion-weighted images demonstrate no evidence for acute or subacute infarction. Midline structures are within normal limits. No hemorrhage or mass lesion is present. Flow is present in the major intracranial arteries. The globes and orbits are intact. Diffuse mucosal thickening is present throughout the maxillary sinuses, ethmoid air cells, and left frontal sinus. The right sphenoidal recess is opacified.  MRA HEAD FINDINGS  The internal carotid arteries are within normal limits from the high cervical segments through the ICA termini. The A1 and M1 segments are normal. The anterior communicating artery is patent. ACA and MCA branch vessels are within normal limits.  The left vertebral artery is slightly dominant to the right. The PICA origins are visualized and normal bilaterally. Both posterior cerebral arteries originate from the basilar tip. The PCA branch vessels are within normal limits.  IMPRESSION: 1. Normal MRI appearance of the brain for age. 2. Mild diffuse sinus disease. 3. Normal variant MRA circle of Willis without evidence for significant proximal stenosis, aneurysm, or branch vessel occlusion.   Electronically Signed   By: Gennette Pac M.D.   On: 08/24/2013 09:08    2D ECHO: Study Conclusions  - Left ventricle: The cavity size was mildly dilated. Systolic function was normal. The estimated ejection fraction was in the range of 60% to 65%. Wall motion was normal; there were no regional wall motion  abnormalities. - Aortic valve: Trivial regurgitation.    Disposition and Follow-up:     Discharge Orders   Future Orders Complete By Expires   Diet - low sodium heart healthy  As directed    Increase activity slowly  As directed        DISPOSITION: Home   DIET: Heart healthy diet   DISCHARGE FOLLOW-UP Follow-up Information   Follow up with Ralene Ok, MD. Schedule an appointment as soon as possible for a visit in 2 weeks.   Specialty:  Internal Medicine   Contact information:   [address] [place] [address] [phone]       Follow up with Patricia Nettle, MD. Schedule an appointment as soon as possible for a visit in 2 weeks. (For hospital followup)    Specialty:  Orthopedic Surgery   Contact information:   [address] [place] [address] [phone]  Time spent on Discharge: 40 minutes  Signed:   RAI,RIPUDEEP M.D. [facility] 08/24/2013, 2:23 PM Pager: [phone]

## 2013-08-24 NOTE — Evaluation (Signed)
Physical Therapy Evaluation Patient Details Name: Norman Arroyo MRN: [id number] DOB: 05-Oct-1950 Today's Date: 08/24/2013 Time: 6295-2841 PT Time Calculation (min): 20 min  PT Assessment / Plan / Recommendation History of Present Illness  63 y.o. male admitted to [facility] on 08/23/13 with history of hypertension, hyperlipidemia, chronic back pain presented to med center [facility] ER with right arm numbness and tingling. History was obtained from the patient who stated that he noticed the right fore arm numbness and tingling when he woke up at 7 AM in the morning. Patient denied any significant weakness in the right arm or dropping things with the right hand. He denied difficulty speaking or weakness in his legs or any facial drooping. He denies any history of prior TIA or stroke. He takes aspirin 325 mg daily, sometimes forgets it. Patient was seen by Dr. Janace Hoard at the office this morning and was sent to the ER for further evaluation.  MRI negative for acute stroke, but MRI of c-spine shows a multitude of changes (see MRI report).    Clinical Impression  Pt presents with noticeable right upper extremity weakness distally.  I encouraged him to take his c-spine MRI to Dr. Patrice Paradise (who did his lumbar surgery) to see how he should proceed and see if he should do some neck therapy while he is going to OP PT for low back therapy- scheduled to start 09/08/13.  He agreed with this plan.  PT to sign off as his gait is at its normal baseline as is his right leg weakness and pain.  He was able to use the cane safely in his left hand.      PT Assessment  All further PT needs can be met in the next venue of care    Follow Up Recommendations  Outpatient PT;Supervision - Intermittent (OP PT- after checking with Dr. Patrice Paradise about his neck)    Does the patient have the potential to tolerate intense rehabilitation     NA  Barriers to Discharge   None      Equipment Recommendations  None recommended by PT     Recommendations for Other Services   None  Frequency   NA-one time eval and d/c   Precautions / Restrictions Precautions Precautions: Fall Precaution Comments: right leg is weak from prior h/o spinal surgery.  Pt uses a cane (normally in his right hand)   Pertinent Vitals/Pain See vitals flow sheet.       Mobility  Transfers Overall transfer level: Modified independent Equipment used: Straight cane General transfer comment: uses upper extremities to assist with transitions Ambulation/Gait Ambulation/Gait assistance: Supervision Ambulation Distance (Feet): 250 Feet Assistive device: Straight cane Gait Pattern/deviations: Step-through pattern;Decreased step length - right;Decreased dorsiflexion - right;Antalgic General Gait Details: pt with weak right leg and antalgic gait pattern.  He did well using the cane in his left hand which is not his normal (he has not been able to hold it with his right hand since this weakness started).   Stairs: Yes Stairs assistance: Supervision Stair Management: One rail Right;Step to pattern;Forwards;With cane Number of Stairs: 9 General stair comments: supervision for safety.  Verbal cues for safest lower extremity sequencing.  Pt attempted reciprocal pattern, but was not strong enough on his right leg to be safe doing this technique and changed back to step to pattern.          PT Diagnosis: Generalized weakness  PT Problem List: Decreased strength;Decreased activity tolerance;Decreased balance;Decreased mobility;Decreased coordination;Impaired sensation;Pain    PT  Goals(Current goals can be found in the care plan section) Acute Rehab PT Goals Patient Stated Goal: to get his arm stronger PT Goal Formulation: No goals set, d/c therapy  Visit Information  Last PT Received On: 08/24/13 Assistance Needed: +1 History of Present Illness: 63 y.o. male admitted to [facility] on 08/23/13 with history of hypertension, hyperlipidemia, chronic back pain  presented to med center [facility] ER with right arm numbness and tingling. History was obtained from the patient who stated that he noticed the right fore arm numbness and tingling when he woke up at 7 AM in the morning. Patient denied any significant weakness in the right arm or dropping things with the right hand. He denied difficulty speaking or weakness in his legs or any facial drooping. He denies any history of prior TIA or stroke. He takes aspirin 325 mg daily, sometimes forgets it. Patient was seen by Dr. Janace Hoard at the office this morning and was sent to the ER for further evaluation.  MRI negative for acute stroke, but MRI of c-spine shows a multitude of changes (see MRI report).         Prior [place] expects to be discharged to:: Private residence Living Arrangements: Spouse/significant other Available Help at Discharge: Family;Available PRN/intermittently Type of Home: House Home Access: Stairs to enter [employer] of Steps: 6 Entrance Stairs-Rails: Right Home Layout: Other (Comment) (split level (6 steps landing 6 steps with rail)) Home Equipment: Cane - quad Additional Comments: Pt had back surgery < 6 months ago and has been walking with a cane since then. He had numbness and weakness in his right leg.  Prior Function Level of Independence: Independent with assistive device(s) Comments: walks with a cane in his right hand normally Communication Communication: No difficulties Dominant Hand: Right    Cognition  Cognition Arousal/Alertness: Awake/alert Behavior During Therapy: WFL for tasks assessed/performed Overall Cognitive Status: Within Functional Limits for tasks assessed    Extremity/Trunk Assessment Upper Extremity Assessment Upper Extremity Assessment: RUE deficits/detail RUE Deficits / Details: 3+/5 wrist extension, 3+/5 wrist flexion, normal biceps, normal shoulder, 3+/5 grip with decreased strength noted in finger  opposition.   RUE Sensation: decreased light touch RUE Coordination: decreased fine motor Lower Extremity Assessment Lower Extremity Assessment: RLE deficits/detail RLE Deficits / Details: h/o right leg deficits due to PMHx of back problems.  Had lower back surgery < 6 months ago RLE Sensation: decreased light touch   Balance Balance Overall balance assessment: No apparent balance deficits (not formally assessed) (at his normal baseline)  End of Session PT - End of Session Activity Tolerance: Patient limited by fatigue;Patient limited by pain Patient left: in bed;with call bell/phone within reach;with family/visitor present (seated EOB)  GP Functional Assessment Tool Used: assist level Functional Limitation: Mobility: Walking and moving around Mobility: Walking and Moving Around Current Status ([id number]): At least 1 percent but less than 20 percent impaired, limited or restricted Mobility: Walking and Moving Around Goal Status [phone]): At least 1 percent but less than 20 percent impaired, limited or restricted Mobility: Walking and Moving Around Discharge Status [phone]): At least 1 percent but less than 20 percent impaired, limited or restricted   Chad Donoghue B. Boykin, [place], DPT [phone]   08/24/2013, 3:43 PM

## 2013-08-24 NOTE — Progress Notes (Signed)
Echo Lab  2D Echocardiogram completed.  Vitoria Conyer L Banita Lehn, RDCS 08/24/2013 10:51 AM

## 2013-08-24 NOTE — Progress Notes (Signed)
Subjective: Patient with continued symptoms.  No progression or new complaints.  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  He has been working on his car recently.    Objective: Current vital signs: BP 124/78  Pulse 70  Temp(Src) 98.5 F (36.9 C) (Oral)  Resp 16  Ht 6\' 1"  (1.854 m)  Wt 109.77 kg (242 lb)  BMI 31.93 kg/m2  SpO2 96% Vital signs in last 24 hours: Temp:  [98 F (36.7 C)-98.6 F (37 C)] 98.5 F (36.9 C) (01/19 0600) Pulse Rate:  [68-86] 70 (01/19 0600) Resp:  [15-18] 16 (01/19 0400) BP: (116-152)/(69-86) 124/78 mmHg (01/19 0600) SpO2:  [96 %-98 %] 96 % (01/19 0600) Weight:  [109.77 kg (242 lb)] 109.77 kg (242 lb) (01/18 1235)  Intake/Output from previous day:   Intake/Output this shift:   Nutritional status: Cardiac  Neurologic Exam: Mental Status:  Alert, oriented, thought content appropriate. Speech fluent without evidence of aphasia. Able to follow 3 step commands without difficulty.  Cranial Nerves:  II: Discs flat bilaterally; Visual fields grossly normal, pupils equal, round, reactive to light and accommodation  III,IV, VI: ptosis not present, extra-ocular motions intact bilaterally  V,VII: smile symmetric, facial light touch sensation normal bilaterally  VIII: hearing normal bilaterally  IX,X: gag reflex present  XI: bilateral shoulder shrug  XII: midline tongue extension without atrophy or fasciculations  Motor:  5/5 throughout Sensory: Decreased light touch on the lateral forearm and the first three fingers.   Deep Tendon Reflexes:  2+ throughout   Lab Results: Basic Metabolic Panel:  Recent Labs Lab 08/23/13 1330 08/23/13 2034 08/24/13 0500  NA 141  --  139  K 4.9  --  4.1  CL 101  --  103  CO2 28  --  22  GLUCOSE 100*  --  106*  BUN 15  --  13  CREATININE 0.90 0.97 0.93  CALCIUM 9.8  --  8.8    Liver Function Tests:  Recent Labs Lab 08/23/13 1330  AST 35  ALT 33   ALKPHOS 104  BILITOT 0.5  PROT 8.8*  ALBUMIN 4.0   No results found for this basename: LIPASE, AMYLASE,  in the last 168 hours No results found for this basename: AMMONIA,  in the last 168 hours  CBC:  Recent Labs Lab 08/23/13 1330 08/23/13 2034 08/24/13 0500  WBC 10.2 8.0 7.2  NEUTROABS 5.4  --   --   HGB 13.7 12.9* 13.2  HCT 41.9 38.1* 39.6  MCV 97.0 95.7 96.6  PLT 193 184 194    Cardiac Enzymes:  Recent Labs Lab 08/23/13 1330  TROPONINI <0.30    Lipid Panel:  Recent Labs Lab 08/24/13 0500  CHOL 129  TRIG 133  HDL 42  CHOLHDL 3.1  VLDL 27  LDLCALC 60    CBG:  Recent Labs Lab 08/23/13 1335  GLUCAP 94    Microbiology: No results found for this or any previous visit.  Coagulation Studies:  Recent Labs  08/23/13 1330  LABPROT 12.0  INR 0.90    Imaging: Dg Chest 2 View  08/24/2013   CLINICAL DATA:  Stroke.  EXAM: CHEST  2 VIEW  COMPARISON:  None.  FINDINGS: The heart size and mediastinal contours are within normal limits. Both lungs are clear. No acute osseous abnormality. Evidence of prior trauma and/or surgery to the left [facility] joint.  IMPRESSION: No acute abnormalities.   Electronically Signed   By:  Geanie Cooley M.D.   On: 08/24/2013 08:40   Ct Head Wo Contrast  08/23/2013   CLINICAL DATA:  Right arm weakness and numbness.  EXAM: CT HEAD WITHOUT CONTRAST  TECHNIQUE: Contiguous axial images were obtained from the base of the skull through the vertex without intravenous contrast.  COMPARISON:  No priors.  FINDINGS: No acute intracranial abnormalities. Specifically, no evidence of acute intracranial hemorrhage, no definite findings of acute/subacute cerebral ischemia, no mass, mass effect, hydrocephalus or abnormal intra or extra-axial fluid collections. Visualized paranasal sinuses and mastoids are generally well pneumatized, with exception of some mild multifocal mucosal thickening throughout the ethmoid sinuses bilaterally, and a small mucosal  retention cyst or polyp in the lateral aspect of the right sphenoid sinus. No acute displaced skull fractures are identified.  IMPRESSION: 1. No acute intracranial abnormalities. 2. The appearance of the brain is normal. 3. Mild paranasal sinus disease, as above, without acute features to suggest acute sinusitis at this time.   Electronically Signed   By: Trudie Reed M.D.   On: 08/23/2013 13:31   Mr Brain Wo Contrast  08/24/2013   CLINICAL DATA:  Right arm numbness and tingling.  Stroke.  EXAM: MRI HEAD WITHOUT CONTRAST  MRA HEAD WITHOUT CONTRAST  TECHNIQUE: Multiplanar, multiecho pulse sequences of the brain and surrounding structures were obtained without intravenous contrast. Angiographic images of the head were obtained using MRA technique without contrast.  COMPARISON:  CT head 08/23/2013.  FINDINGS: MRI HEAD FINDINGS  The diffusion-weighted images demonstrate no evidence for acute or subacute infarction. Midline structures are within normal limits. No hemorrhage or mass lesion is present. Flow is present in the major intracranial arteries. The globes and orbits are intact. Diffuse mucosal thickening is present throughout the maxillary sinuses, ethmoid air cells, and left frontal sinus. The right sphenoidal recess is opacified.  MRA HEAD FINDINGS  The internal carotid arteries are within normal limits from the high cervical segments through the ICA termini. The A1 and M1 segments are normal. The anterior communicating artery is patent. ACA and MCA branch vessels are within normal limits.  The left vertebral artery is slightly dominant to the right. The PICA origins are visualized and normal bilaterally. Both posterior cerebral arteries originate from the basilar tip. The PCA branch vessels are within normal limits.  IMPRESSION: 1. Normal MRI appearance of the brain for age. 2. Mild diffuse sinus disease. 3. Normal variant MRA circle of Willis without evidence for significant proximal stenosis, aneurysm,  or branch vessel occlusion.   Electronically Signed   By: Gennette Pac M.D.   On: 08/24/2013 09:08   Mr Cervical Spine Wo Contrast  08/24/2013   CLINICAL DATA:  Right arm numbness and tingling.  No trauma.  EXAM: MRI CERVICAL SPINE WITHOUT CONTRAST  TECHNIQUE: Multiplanar, multisequence MR imaging was performed. No intravenous contrast was administered.  COMPARISON:  MR brain performed same date and dictated separately. No comparison cervical spine MR.  FINDINGS: Cervical medullary junction and visualized paravertebral structure unremarkable.  Slightly heterogeneous bone marrow. Slight increased signal endplate surrounding the C6-7 and C7-T1 disc space most likely related to discogenic changes.  Questionable minimal increased signal within the cord at the C6-7 level the may represent artifact although difficult to completely exclude mild gliosis or edema related to cervical spondylotic changes as discussed below.  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 Additionally:  C2-3:  Minimal central bulge.  C3-4: Broad-based lobulated disc osteophyte with greater extension left posterior lateral position. Impression  upon the ventral nerve roots greater on the left. Minimal contact with the left lateral ventral cord. Uncinate hypertrophy. Mild bilateral foraminal narrowing  C4-5: Mild bulge/osteophyte. Mild spinal stenosis. Uncinate hypertrophy with mild right-sided foraminal narrowing.  C5-6: Broad-based disc osteophyte complex greater to left. Spinal stenosis greater on left with mild cord contact. Ventral nerve root compression greater on the left. Uncinate hypertrophy. Moderate foraminal narrowing greater on left.  C6-7: Broad-based disc osteophyte complex. Spinal stenosis with mild cord contact. Uncinate hypertrophy. Moderate to slightly marked foraminal narrowing.  C7-T1: Shallow broad-based disc osteophyte complex. Mild spinal stenosis. No cord contact. Uncinate hypertrophy with mild bilateral  foraminal narrowing.  T1-2: Mild facet joint degenerative changes. Minimal uncinate hypertrophy and mild bilateral foraminal narrowing.  T2-3: Shallow disc osteophyte. Mild spinal stenosis. No cord compression. Mild bilateral foraminal narrowing.  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  Summary pertinent findings includes:  C3-4 broad-based lobulated disc osteophyte with greater extension left posterior lateral position. Impression upon the ventral nerve roots greater on the left. Minimal contact with the left lateral ventral cord. Uncinate hypertrophy. Mild bilateral foraminal narrowing  C4-5 mild bulge/osteophyte. Mild spinal stenosis. Uncinate hypertrophy with mild right-sided foraminal narrowing.  C5-6 broad-based disc osteophyte complex greater to left. Spinal stenosis greater on left with mild cord contact. Ventral nerve root compression greater on the left. Uncinate hypertrophy. Moderate foraminal narrowing greater on left.  C6-7 broad-based disc osteophyte complex. Spinal stenosis with mild cord contact. Uncinate hypertrophy. Moderate to slightly marked foraminal narrowing. Questionable minimal increased signal within the cord at this level probably is related to artifact although difficult to completely exclude a mild component of edema/gliosis.  C7-T1: Shallow broad-based disc osteophyte complex. Mild spinal stenosis. No cord contact. Uncinate hypertrophy with mild bilateral foraminal narrowing.   Electronically Signed   By: Bridgett Larsson M.D.   On: 08/24/2013 09:33   Mr Maxine Glenn Head/brain Wo Cm  08/24/2013   CLINICAL DATA:  Right arm numbness and tingling.  Stroke.  EXAM: MRI HEAD WITHOUT CONTRAST  MRA HEAD WITHOUT CONTRAST  TECHNIQUE: Multiplanar, multiecho pulse sequences of the brain and surrounding structures were obtained without intravenous contrast. Angiographic images of the head were obtained using MRA technique without contrast.  COMPARISON:  CT head 08/23/2013.   FINDINGS: MRI HEAD FINDINGS  The diffusion-weighted images demonstrate no evidence for acute or subacute infarction. Midline structures are within normal limits. No hemorrhage or mass lesion is present. Flow is present in the major intracranial arteries. The globes and orbits are intact. Diffuse mucosal thickening is present throughout the maxillary sinuses, ethmoid air cells, and left frontal sinus. The right sphenoidal recess is opacified.  MRA HEAD FINDINGS  The internal carotid arteries are within normal limits from the high cervical segments through the ICA termini. The A1 and M1 segments are normal. The anterior communicating artery is patent. ACA and MCA branch vessels are within normal limits.  The left vertebral artery is slightly dominant to the right. The PICA origins are visualized and normal bilaterally. Both posterior cerebral arteries originate from the basilar tip. The PCA branch vessels are within normal limits.  IMPRESSION: 1. Normal MRI appearance of the brain for age. 2. Mild diffuse sinus disease. 3. Normal variant MRA circle of Willis without evidence for significant proximal stenosis, aneurysm, or branch vessel occlusion.   Electronically Signed   By: Gennette Pac M.D.   On: 08/24/2013 09:08    Medications:  I have reviewed the patient's current medications. Scheduled: .  aspirin  300 mg Rectal Daily   Or  . aspirin  325 mg Oral Daily  . cholecalciferol  2,000 Units Oral QHS  . enoxaparin (LOVENOX) injection  40 mg Subcutaneous Q24H  . gabapentin  400 mg Oral TID  . niacin  1,000 mg Oral QHS  . simvastatin  20 mg Oral QHS  . tiZANidine  4 mg Oral Q8H    Assessment/Plan: Sensory findings are the only findings on neurological examination.  They are more peripheral in distribution.  MRI of the brain reviewed and show no acute changes.  MRA is unremarkable.  Carotid doppler shows no hemodynamically significant stenosis.  Echo is pending.  Recommendations: 1.  Echo pending 2.   No further neurologic intervention is recommended at this time.  If further questions arise, please call or page at that time.  Thank you for allowing neurology to participate in the care of this patient.  Patient may follow up with neurology as an outpatient.    Case discussed with Dr. Isidoro Donning    LOS: 1 day   Thana Farr, MD [facility] [phone] 08/24/2013  12:08 PM

## 2015-01-11 IMAGING — CR DG CHEST 2V
2 series · 2 of 2 positions shown · non-contrast
Comparison: None.

CLINICAL DATA: Stroke.

EXAM:
CHEST  2 VIEW

[w chest pa]
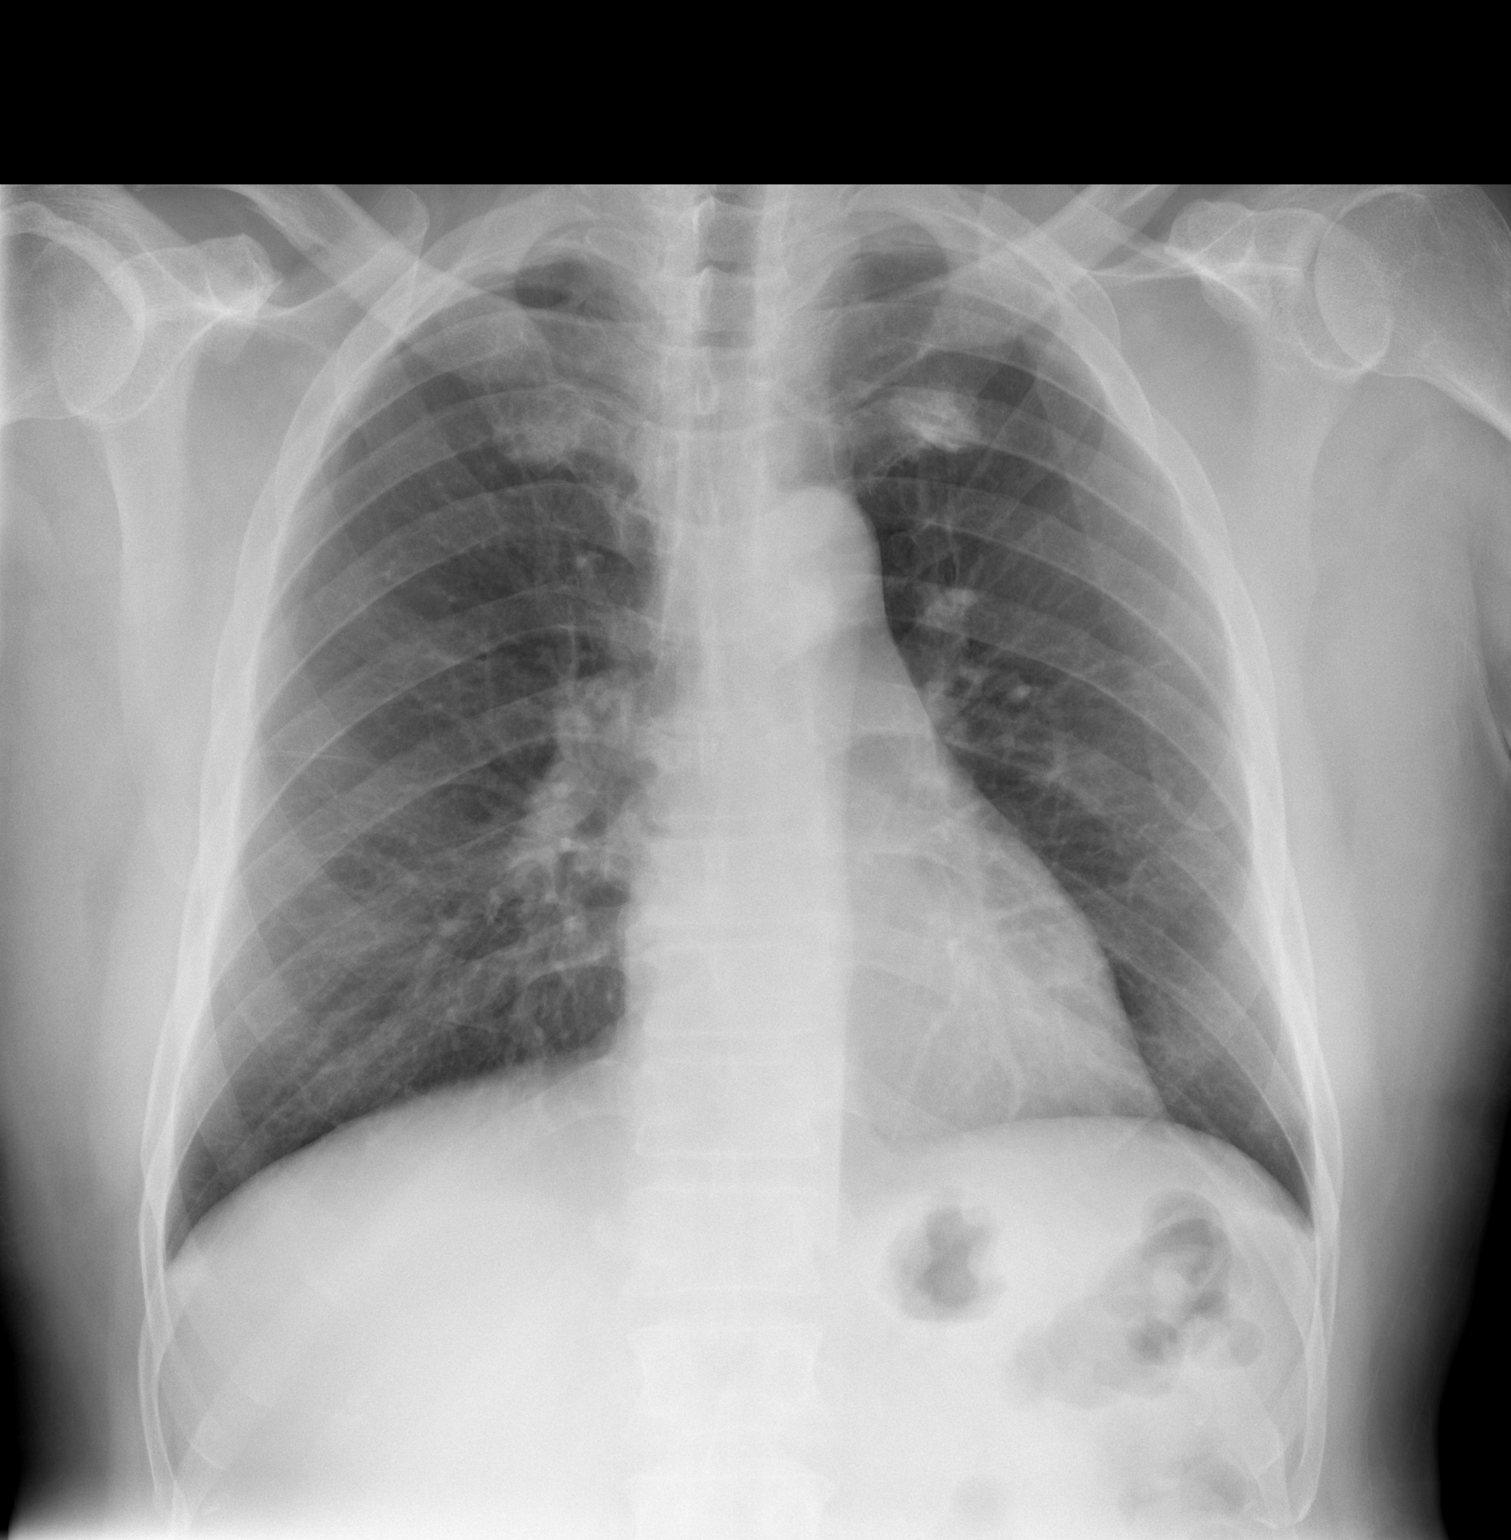

[w chest lat]
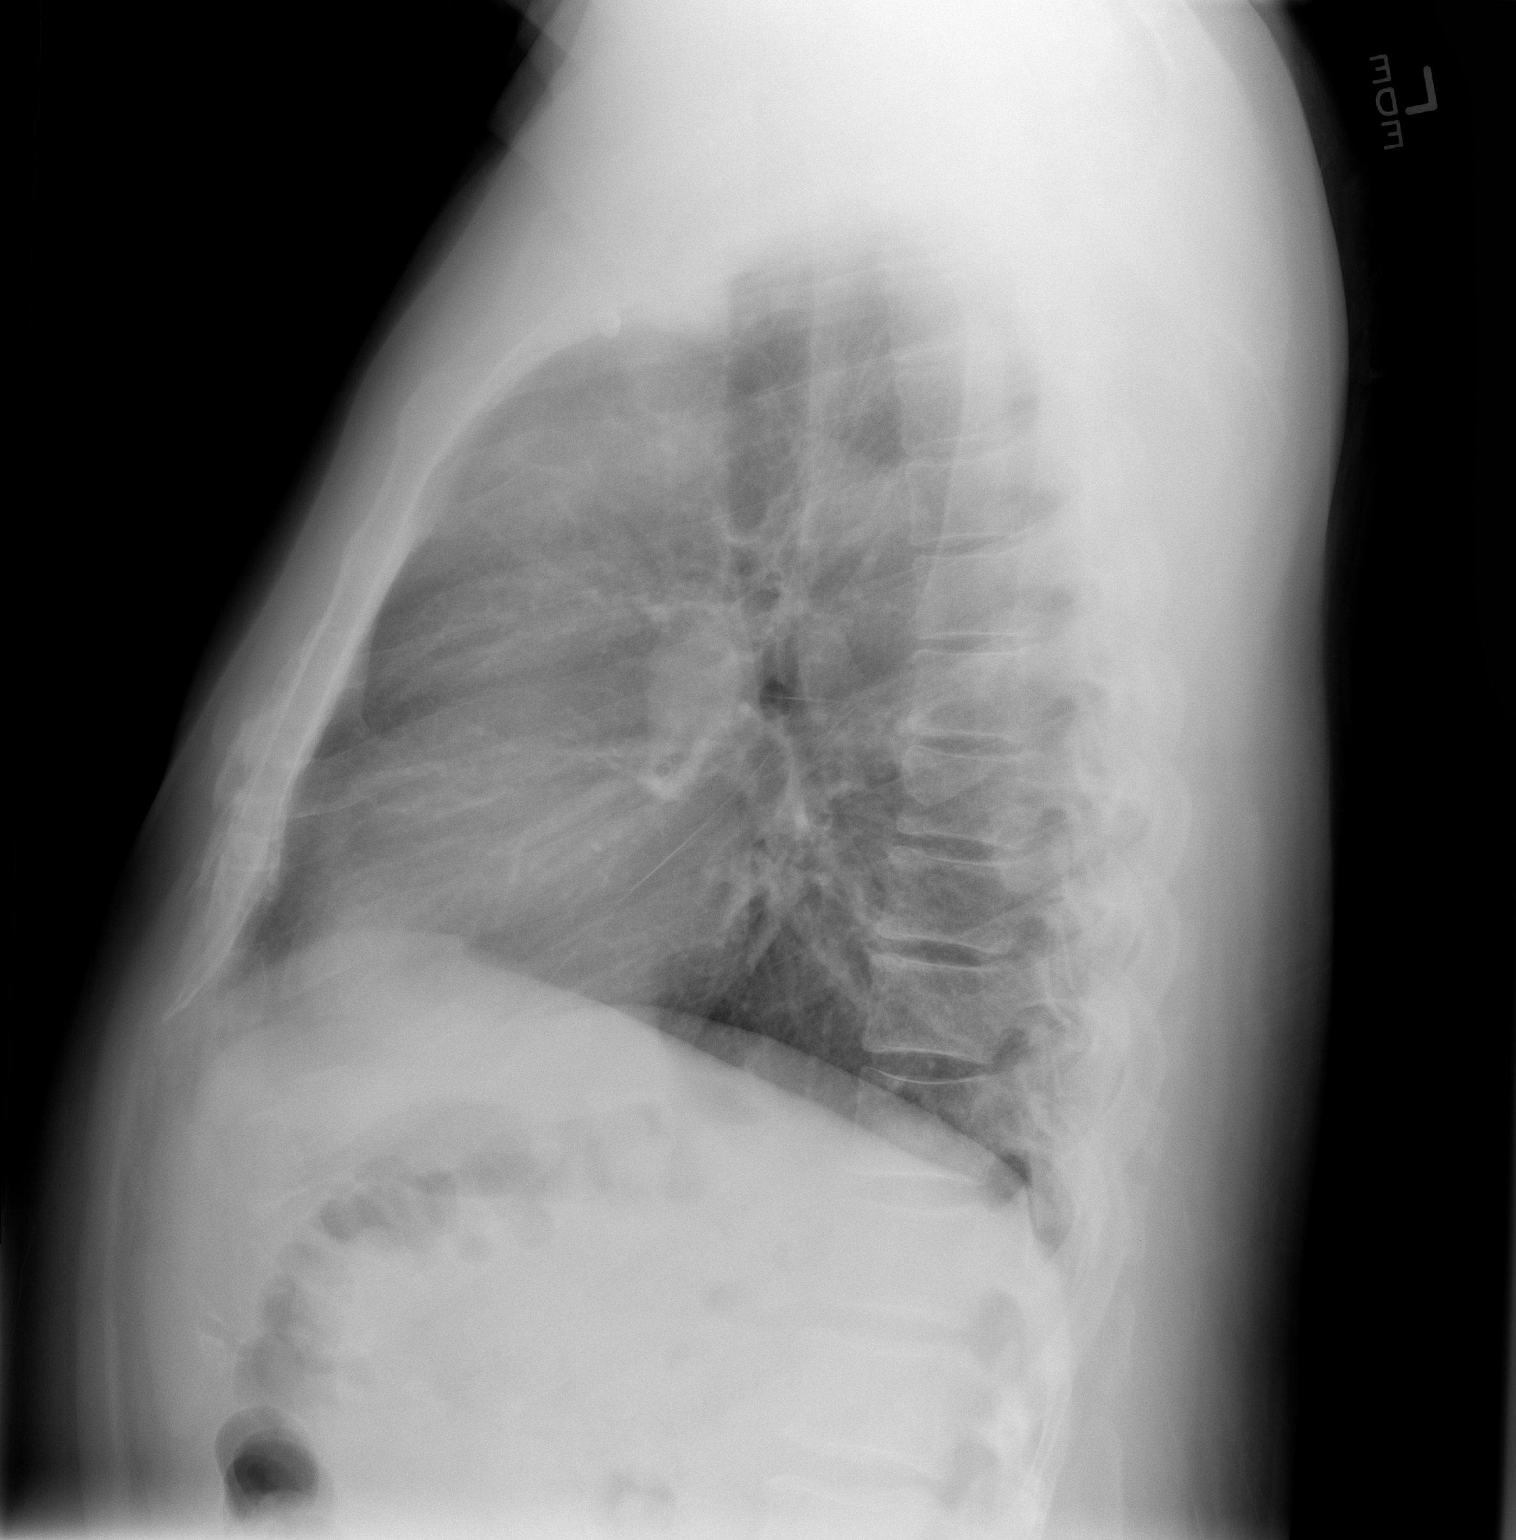

[2 of 2 positions shown; findings below may reference images not displayed]

FINDINGS: The heart size and mediastinal contours are within normal limits.
Both lungs are clear. No acute osseous abnormality. Evidence of
prior trauma and/or surgery to the left AC joint.
IMPRESSION: No acute abnormalities.

## 2015-12-27 ENCOUNTER — Other Ambulatory Visit: Payer: Self-pay | Admitting: Orthopaedic Surgery

## 2015-12-27 DIAGNOSIS — M47816 Spondylosis without myelopathy or radiculopathy, lumbar region: Secondary | ICD-10-CM

## 2016-01-01 ENCOUNTER — Other Ambulatory Visit: Payer: Self-pay

## 2016-01-18 ENCOUNTER — Ambulatory Visit
Admission: RE | Admit: 2016-01-18 | Discharge: 2016-01-18 | Disposition: A | Discharge status: Home / Self Care | Payer: Medicare HMO | Source: Ambulatory Visit | Attending: Orthopaedic Surgery | Admitting: Orthopaedic Surgery

## 2016-01-18 DIAGNOSIS — M47816 Spondylosis without myelopathy or radiculopathy, lumbar region: Secondary | ICD-10-CM

## 2021-07-18 ENCOUNTER — Encounter (HOSPITAL_BASED_OUTPATIENT_CLINIC_OR_DEPARTMENT_OTHER): Payer: Self-pay | Admitting: Emergency Medicine

## 2021-07-18 ENCOUNTER — Emergency Department (HOSPITAL_BASED_OUTPATIENT_CLINIC_OR_DEPARTMENT_OTHER): Payer: Medicare HMO

## 2021-07-18 ENCOUNTER — Emergency Department (HOSPITAL_BASED_OUTPATIENT_CLINIC_OR_DEPARTMENT_OTHER)
Admission: EM | Admit: 2021-07-18 | Discharge: 2021-07-18 | Disposition: A | Discharge status: Home / Self Care | Payer: Medicare HMO | Attending: Emergency Medicine | Admitting: Emergency Medicine

## 2021-07-18 ENCOUNTER — Other Ambulatory Visit: Payer: Self-pay

## 2021-07-18 DIAGNOSIS — R300 Dysuria: Secondary | ICD-10-CM | POA: Diagnosis present

## 2021-07-18 DIAGNOSIS — I493 Ventricular premature depolarization: Secondary | ICD-10-CM | POA: Insufficient documentation

## 2021-07-18 DIAGNOSIS — M79605 Pain in left leg: Secondary | ICD-10-CM | POA: Diagnosis not present

## 2021-07-18 DIAGNOSIS — Z7982 Long term (current) use of aspirin: Secondary | ICD-10-CM | POA: Insufficient documentation

## 2021-07-18 DIAGNOSIS — N3001 Acute cystitis with hematuria: Secondary | ICD-10-CM | POA: Diagnosis not present

## 2021-07-18 DIAGNOSIS — I491 Atrial premature depolarization: Secondary | ICD-10-CM | POA: Insufficient documentation

## 2021-07-18 DIAGNOSIS — I10 Essential (primary) hypertension: Secondary | ICD-10-CM | POA: Insufficient documentation

## 2021-07-18 DIAGNOSIS — M79604 Pain in right leg: Secondary | ICD-10-CM | POA: Diagnosis not present

## 2021-07-18 DIAGNOSIS — R7989 Other specified abnormal findings of blood chemistry: Secondary | ICD-10-CM

## 2021-07-18 LAB — URINALYSIS, ROUTINE W REFLEX MICROSCOPIC
Glucose, UA: NEGATIVE mg/dL
Ketones, ur: 40 mg/dL — AB
Nitrite: POSITIVE — AB
Protein, ur: 100 mg/dL — AB
Specific Gravity, Urine: 1.015 (ref 1.005–1.030)
pH: 6 (ref 5.0–8.0)

## 2021-07-18 LAB — URINALYSIS, MICROSCOPIC (REFLEX): WBC, UA: >50 WBC/hpf (ref 0–5)

## 2021-07-18 LAB — LACTIC ACID, PLASMA: Lactic Acid, Venous: 1.2 mmol/L (ref 0.5–1.9)

## 2021-07-18 MED ORDER — SODIUM CHLORIDE 0.9 % IV SOLN
1000.0000 mL | INTRAVENOUS | Status: DC
  Administered 2021-07-18: 1000 mL via INTRAVENOUS

## 2021-07-18 MED ORDER — SODIUM CHLORIDE 0.9 % IV BOLUS (SEPSIS): 1000.0000 mL | Freq: Once | INTRAVENOUS | Status: DC

## 2021-07-18 MED ORDER — SODIUM CHLORIDE 0.9 % IV BOLUS (SEPSIS)
1000.0000 mL | Freq: Once | INTRAVENOUS | Status: AC
  Administered 2021-07-18: 1000 mL via INTRAVENOUS

## 2021-07-18 MED ORDER — CEFUROXIME AXETIL 500 MG PO TABS: 500.0000 mg | ORAL_TABLET | Freq: Two times a day (BID) | ORAL | 0 refills | Status: AC

## 2021-07-18 MED ORDER — SODIUM CHLORIDE 0.9 % IV SOLN: 1000.0000 mL | INTRAVENOUS | Status: DC

## 2021-07-18 MED ORDER — METOPROLOL TARTRATE 5 MG/5ML IV SOLN
5.0000 mg | Freq: Once | INTRAVENOUS | Status: AC
  Administered 2021-07-18: 5 mg via INTRAVENOUS
  Filled 2021-07-18: qty 5

## 2021-07-18 MED ORDER — SODIUM CHLORIDE 0.9 % IV SOLN
1.0000 g | Freq: Once | INTRAVENOUS | Status: AC
  Administered 2021-07-18: 1 g via INTRAVENOUS
  Filled 2021-07-18: qty 10

## 2021-07-18 NOTE — ED Notes (Signed)
Discharge instructions including prescription and follow up care discussed with pt and family member at bedside. Pt was able to verbalize understanding. Pt verbalized understanding of recommended cardiology follow up.   Per pt prescribed ABX at urgent care today. Pt informed Dr Lynelle Doctor discontinue medication and start prescribed medication today. Hold abx for urine culture results. Pt verbalized understanding of theses instructions by EDP. No other questions at this time. Pt wheelchair to lobby.

## 2021-07-18 NOTE — ED Notes (Signed)
Pt given urinal per request.

## 2021-07-18 NOTE — ED Notes (Signed)
Patient transported to CT 

## 2021-07-18 NOTE — ED Notes (Signed)
Portable Xray at bedside.

## 2021-07-18 NOTE — ED Notes (Signed)
[place] at bedside performing DVT study.

## 2021-07-18 NOTE — ED Provider Notes (Signed)
[facility] EMERGENCY DEPARTMENT Provider Note   CSN: [id number] Arrival date & time: 07/18/21  1603     History Chief Complaint  Patient presents with   Abnormal Labs    Norman Arroyo is a 70 y.o. male.  HPI  Patient states he noticed that he was having some issues with blood in his urine last day or 2.  He is also had some discomfort with urination.  Patient went to an urgent care today.  While he was there he had a number of blood tests.  He had a comprehensive metabolic panel that did show elevated bilirubin at 4.4.  He had a D-dimer that was elevated at 3340.  Had a normal troponin.  He also had an elevated procalcitonin at 1.52.  Patient denies any trouble with leg swelling.  He denies any trouble with chest pain.  I he denies any fevers.  He does not have any vomiting or diarrhea..  Patient was called about those results and instructed to come to the emergency room.  He was seen at [facility] urgent [facility] this morning.  Past Medical History:  Diagnosis Date   Achilles bursitis or tendinitis    Calcaneal spur    Corns and callosities    Disturbance of skin sensation    Encounter for long-term (current) use of other medications    Foot and toe(s), insect bite, nonvenomous, without mention of infection    Hypertension    Lumbago    Nonspecific abnormal electrocardiogram (ECG) (EKG)    Other abnormal glucose 272.4   Other and unspecified hyperlipidemia    Pain in joint, pelvic region and thigh    Sprain of lumbar region    Sprain of thoracic region     Patient Active Problem List   Diagnosis Date Noted   Right arm weakness 08/23/2013   HTN (hypertension) 08/23/2013   Other and unspecified hyperlipidemia 08/23/2013   Right arm numbness 08/23/2013    Past Surgical History:  Procedure Laterality Date   BACK SURGERY         No family history on file.  Social History   Tobacco Use   Smoking status: Never  Substance Use Topics   Alcohol  use: Yes   Drug use: No    Home Medications Prior to Admission medications   Medication Sig Start Date End Date Taking? Authorizing Provider  cefUROXime (CEFTIN) 500 MG tablet Take 1 tablet (500 mg total) by mouth 2 (two) times daily with a meal for 7 days. 07/18/21 07/25/21 Yes Dorie Rank, MD  aspirin 325 MG tablet Take 325 mg by mouth at bedtime.     [provider]  Cholecalciferol (VITAMIN D) 2000 UNITS tablet Take 2,000 Units by mouth at bedtime.    [provider]  gabapentin (NEURONTIN) 400 MG capsule Take 1 capsule (400 mg total) by mouth 3 (three) times daily. 08/24/13   Rai, Vernelle Emerald, MD  HYDROcodone-acetaminophen (NORCO) 10-325 MG per tablet Take 1 tablet by mouth 2 (two) times daily as needed (pain).     [provider]  niacin (NIASPAN) 1000 MG CR tablet Take 1,000 mg by mouth at bedtime.    [provider]  simvastatin (ZOCOR) 20 MG tablet Take 20 mg by mouth at bedtime.     [provider]  tiZANidine (ZANAFLEX) 4 MG capsule Take 4 mg by mouth every 8 (eight) hours.     [provider]    Allergies    Patient has  no known allergies.  Review of Systems   Review of Systems  All other systems reviewed and are negative.  Physical Exam Updated Vital Signs BP (!) 120/58    Pulse (!) 108    Temp 99.8 F (37.7 C) (Oral)    Resp (!) 24    Ht 1.854 m (6\' 1" )    Wt 107.5 kg    SpO2 97%    BMI 31.27 kg/m   Physical Exam Vitals and nursing note reviewed.  Constitutional:      General: He is not in acute distress.    Appearance: He is well-developed.  HENT:     Head: Normocephalic and atraumatic.     Right Ear: External ear normal.     Left Ear: External ear normal.  Eyes:     General: No scleral icterus.       Right eye: No discharge.        Left eye: No discharge.     Conjunctiva/sclera: Conjunctivae normal.  Neck:     Trachea: No tracheal deviation.  Cardiovascular:     Rate and Rhythm: Normal rate and regular  rhythm.  Pulmonary:     Effort: Pulmonary effort is normal. No respiratory distress.     Breath sounds: Normal breath sounds. No stridor. No wheezing or rales.  Abdominal:     General: Bowel sounds are normal. There is no distension.     Palpations: Abdomen is soft.     Tenderness: There is no abdominal tenderness. There is no guarding or rebound.  Musculoskeletal:        General: No tenderness or deformity.     Cervical back: Neck supple.  Skin:    General: Skin is warm and dry.     Findings: No rash.  Neurological:     General: No focal deficit present.     Mental Status: He is alert.     Cranial Nerves: No cranial nerve deficit (no facial droop, extraocular movements intact, no slurred speech).     Sensory: No sensory deficit.     Motor: No abnormal muscle tone or seizure activity.     Coordination: Coordination normal.  Psychiatric:        Mood and Affect: Mood normal.    ED Results / Procedures / Treatments   Labs (all labs ordered are listed, but only abnormal results are displayed) Labs Reviewed  URINALYSIS, ROUTINE W REFLEX MICROSCOPIC - Abnormal; Notable for the following components:      Result Value   Color, Urine ORANGE (*)    APPearance CLOUDY (*)    Hgb urine dipstick LARGE (*)    Bilirubin Urine MODERATE (*)    Ketones, ur 40 (*)    Protein, ur 100 (*)    Nitrite POSITIVE (*)    Leukocytes,Ua LARGE (*)    All other components within normal limits  URINALYSIS, MICROSCOPIC (REFLEX) - Abnormal; Notable for the following components:   Bacteria, UA MANY (*)    All other components within normal limits  URINE CULTURE  CULTURE, BLOOD (ROUTINE X 2)  CULTURE, BLOOD (ROUTINE X 2)  LACTIC ACID, PLASMA    EKG EKG Interpretation  Date/Time:  Tuesday July 18 2021 17:23:01 EST Ventricular Rate:  126 PR Interval:  128 QRS Duration: 110 QT Interval:  309 QTC Calculation: 401 R Axis:   85 Text Interpretation: Sinus tachycardia Multiple premature complexes,  vent & supraven Incomplete left bundle branch block ST depression, consider ischemia, lateral lds Minimal ST elevation, lateral  leads Confirmed by Linwood Dibbles [phone]) on 07/18/2021 5:41:36 PM  Radiology US Venous Img Lower Bilateral (DVT)  Result Date: 07/18/2021 CLINICAL DATA:  Elevated D-dimer EXAM: Bilateral LOWER EXTREMITY VENOUS DOPPLER ULTRASOUND TECHNIQUE: Gray-scale sonography with compression, as well as color and duplex ultrasound, were performed to evaluate the deep venous system(s) from the level of the common femoral vein through the popliteal and proximal calf veins. COMPARISON:  None. FINDINGS: VENOUS Normal compressibility of the common femoral, superficial femoral, and popliteal veins, as well as the visualized calf veins. Visualized portions of profunda femoral vein and great saphenous vein unremarkable. No filling defects to suggest DVT on grayscale or color Doppler imaging. Doppler waveforms show normal direction of venous flow, normal respiratory plasticity and response to augmentation. OTHER None. Limitations: none IMPRESSION: Negative. Electronically Signed   By: Jasmine Pang M.D.   On: 07/18/2021 18:11   DG Chest Portable 1 View  Result Date: 07/18/2021 CLINICAL DATA:  Tachycardia.  Chest pain and shortness of breath. EXAM: PORTABLE CHEST 1 VIEW COMPARISON:  08/24/2013 FINDINGS: 1749 hours. Low volumes. Cardiopericardial silhouette is at upper limits of normal for size. The lungs are clear without focal pneumonia, edema, pneumothorax or pleural effusion. The visualized bony structures of the thorax show no acute abnormality. Telemetry leads overlie the chest. IMPRESSION: Low volume film without acute cardiopulmonary findings. Electronically Signed   By: Kennith Center M.D.   On: 07/18/2021 18:20   CT Renal Stone Study  Result Date: 07/18/2021 CLINICAL DATA:  Hematuria EXAM: CT ABDOMEN AND PELVIS WITHOUT CONTRAST TECHNIQUE: Multidetector CT imaging of the abdomen and pelvis was  performed following the standard protocol without IV contrast. COMPARISON:  05/07/2013 from [facility] FINDINGS: Lower chest: Clear lung bases. Normal heart size without pericardial or pleural effusion. Hepatobiliary: Normal noncontrast appearance of the liver, gallbladder, biliary tract. Pancreas: Normal, without mass or ductal dilatation. Spleen: Normal in size, without focal abnormality. Adrenals/Urinary Tract: Normal adrenal glands. No renal calculi or hydronephrosis. Low-density left renal lesions of up to 1.3 cm are likely cysts. No hydroureter or ureteric calculi. No bladder calculi. The bladder wall appears mildly thickened, but is underdistended. Pelvic edema is favored to be centered about the bladder, seminal vesicles, and prostate including on 76/2. Stomach/Bowel: Normal stomach, without wall thickening. Extensive colonic diverticulosis. Normal terminal ileum and appendix. Normal small bowel. Vascular/Lymphatic: Aortic atherosclerosis. No abdominopelvic adenopathy. Reproductive: Mild prostatomegaly.  Pelvic edema as detailed above. Other: Tiny fat containing right inguinal hernia. No significant free fluid. No free intraperitoneal air. Musculoskeletal: Lumbosacral spondylosis. IMPRESSION: 1.  No urinary tract calculi or hydronephrosis. 2. Pelvic edema centered about the bladder, seminal vesicles, and prostate in the setting of bladder wall thickening. Suspicious for cystitis and/or prostatitis. 3.  Aortic Atherosclerosis (ICD10-I70.0). Electronically Signed   By: Jeronimo Greaves M.D.   On: 07/18/2021 20:05    Procedures Procedures   Medications Ordered in ED Medications  sodium chloride 0.9 % bolus 1,000 mL (0 mLs Intravenous Stopped 07/18/21 1806)    Followed by  0.9 %  sodium chloride infusion (1,000 mLs Intravenous New Bag/Given 07/18/21 1753)  cefTRIAXone (ROCEPHIN) 1 g in sodium chloride 0.9 % 100 mL IVPB (0 g Intravenous Stopped 07/18/21 1806)  metoprolol tartrate (LOPRESSOR)  injection 5 mg (5 mg Intravenous Given 07/18/21 1753)    ED Course  I have reviewed the triage vital signs and the nursing notes.  Pertinent labs & imaging results that were available during my care of the patient were reviewed by  me and considered in my medical decision making (see chart for details).  Clinical Course as of 07/18/21 2044  Tue Jul 18, 2021  1858 No signs of DVT on the ultrasounds [JK]  1859 Chest x-ray without acute findings. [id number] Blood culture and lactic acid level are normal. [JK]  2009 Urinalysis does suggest urinary tract infection.  Patient does have many bacteria greater than 50 white blood cells. [JK]  2010 CT scan suggestive of bladder infection [JK]    Clinical Course User Index [JK] Dorie Rank, MD   MDM Rules/Calculators/A&P                           Patient initially presented to the doctor's office today for blood in his urine.  He had laboratory test as an outpatient that showed elevated D-dimer and procalcitonin.  In the ER the patient has been afebrile.  He did not have a leukocytosis today.  He is not having any trouble with any chest pain shortness of breath or leg swelling.  Because of the elevated D-dimer he did have Doppler studies of his legs today and that was negative.  I do not think PE is likely without him having any chest pain or shortness of breath.  CT angiogram deferred.  Patient's initial lactic acid level was normal.  No findings to suggest sepsis.  Patient's urinalysis does suggest a UTI.  He was given a dose of Rocephin.  CT scan shows findings consistent with cystitis but no other acute abnormality.  Patient otherwise is feeling well and will treat him with antibiotics as an outpatient.  Warning signs precautions discussed.  Regarding the palpitations patient was noted to have episodes of PVCs and PACs and tachycardia.  There was some question of episodes of A. fib on the monitor.  Patient's EKGs however continue to show sinus  tachycardia.  His heart rate has improved with treatment in the ER.  I do think he would benefit from outpatient cardiology follow-up. Final Clinical Impression(s) / ED Diagnoses Final diagnoses:  Acute cystitis with hematuria  PVC (premature ventricular contraction)  PAC (premature atrial contraction)    Rx / DC Orders ED Discharge Orders          Ordered    cefUROXime (CEFTIN) 500 MG tablet  2 times daily with meals        07/18/21 2044             Dorie Rank, MD 07/18/21 2045

## 2021-07-18 NOTE — ED Triage Notes (Addendum)
Pt sent from UC due to abnormal labs (see chart). Originally went to [facility] for hematuria and weakness. Pt currently denies cp, shob, abdominal pain, n/v, fevers.

## 2021-07-18 NOTE — Discharge Instructions (Signed)
Take the antibiotics as prescribed.  Follow-up with your doctor to make sure the urine infection clears.  Return to the ER for fever or worsening symptoms.  Follow up with a cardiologist as we discussed to evaluate the irregular heart rate

## 2021-07-19 LAB — URINE CULTURE: Culture: <10000 — AB

## 2021-07-21 ENCOUNTER — Telehealth (HOSPITAL_BASED_OUTPATIENT_CLINIC_OR_DEPARTMENT_OTHER): Payer: Self-pay | Admitting: Emergency Medicine

## 2021-07-21 LAB — BLOOD CULTURE ID PANEL (REFLEXED) - BCID2

## 2021-07-21 NOTE — Telephone Encounter (Signed)
Receive blood culture results from lab. Consulted with Dr. Read Drivers who advised pt is currently on correct antibiotics. No further action needed.

## 2021-07-23 LAB — CULTURE, BLOOD (ROUTINE X 2)
Culture: NO GROWTH
Special Requests: ADEQUATE
Special Requests: ADEQUATE

## 2021-07-24 ENCOUNTER — Telehealth: Payer: Self-pay | Admitting: *Deleted

## 2021-07-24 NOTE — Telephone Encounter (Signed)
Post ED Visit - Positive Culture Follow-up: Unsuccessful Patient Follow-up  Culture assessed and recommendations reviewed by:  []  , Pharm.D. []  Enzo Bi, Pharm.D., BCPS AQ-ID []  , Pharm.D., BCPS []  Celedonio Miyamoto, .D., BCPS []  [place], .D., BCPS, AAHIVP []  Georgina Pillion, Pharm.D., BCPS, AAHIVP []  [address], PharmD []  , PharmD, BCPS    []  Patient discharged without antimicrobial prescription and treatment is now indicated []  Organism is resistant to prescribed ED discharge antimicrobial [x]  Patient with positive blood cultures  Plan:  Symptom check, if not doing well, febrile or symptomatic return to ED for evaluation, [place], PA-C  Unable to contact patient after 3 attempts, letter will be sent to address on file  [address] 07/24/2021, 10:36 AM

## 2021-07-31 ENCOUNTER — Telehealth: Payer: Self-pay | Admitting: Emergency Medicine

## 2021-09-22 ENCOUNTER — Telehealth: Payer: Self-pay

## 2021-09-22 NOTE — Telephone Encounter (Signed)
Spoke with pt regarding office visit with Dr. Allyson Sabal, per Arnette Felts, PA for possible heart cath. Able to schedule appointment for pt for next week. Instructions and address given to pt for how to get to our office. Pt verbalizes understanding.

## 2021-09-26 ENCOUNTER — Other Ambulatory Visit: Payer: Self-pay

## 2021-09-26 ENCOUNTER — Encounter: Payer: Self-pay | Admitting: Cardiovascular Disease

## 2021-09-26 ENCOUNTER — Ambulatory Visit: Payer: Medicare HMO | Admitting: Cardiovascular Disease

## 2021-09-26 DIAGNOSIS — R9439 Abnormal result of other cardiovascular function study: Secondary | ICD-10-CM | POA: Diagnosis not present

## 2021-09-26 DIAGNOSIS — I4729 Other ventricular tachycardia: Secondary | ICD-10-CM

## 2021-09-26 DIAGNOSIS — E782 Mixed hyperlipidemia: Secondary | ICD-10-CM

## 2021-09-26 MED ORDER — METOPROLOL TARTRATE 100 MG PO TABS: 100.0000 mg | ORAL_TABLET | Freq: Once | ORAL | 0 refills | Status: DC

## 2021-09-26 NOTE — Assessment & Plan Note (Signed)
Norman Arroyo had a Myoview stress test performed 09/14/2021 that showed inferior scar without ischemia.  His EF on that test was 45%.  He denies chest pain.  It certainly possible that his scar was the plane artifact/diaphragmatic attenuation.  His EF by 2D echo performed 08/25/2021 was normal without any mention of a wall motion abnormality in the inferior wall.  I am going to get a coronary CTA to further evaluate.  If this shows obstructive disease we will move towards cardiac catheterization.

## 2021-09-26 NOTE — Progress Notes (Signed)
Orders place for EP consult per Dr. Allyson Sabal. Called pt and left message on voicemail to make him aware of this recommendation of Dr. Allyson Sabal.

## 2021-09-26 NOTE — Assessment & Plan Note (Signed)
History of hyperlipidemia on simvastatin followed by his PCP 

## 2021-09-26 NOTE — Progress Notes (Signed)
09/26/2021 [name]   23-Jun-1951  [id number]  Primary Physician Jilda Panda, MD Primary Cardiologist: Lorretta Harp MD Lupe Carney, [place]  HPI:  Norman Arroyo is a 71 y.o. thin-appearing married African-American male father of 3 children, grandfather of 4 grandchildren referred by [name], PA-C for cardiac catheterization because of an abnormal Myoview stress test and nonsustained ventricular tachycardia.  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  His risk factors include discontinue tobacco abuse 23 years ago as well as treated hyperlipidemia.  There is no family history for heart disease.  Is never had a heart attack or stroke.  Denies chest pain or shortness of breath.  He apparently had runs of nonsustained ventricular tachycardia on a 30-day event monitor although he denies palpitations.  A Myoview stress test performed 09/14/2021 showed inferior scar without ischemia and a 2D echo performed 08/25/2021 revealed normal LV function without mention of the wall motion abnormality.  He was referred for diagnostic coronary angiography.  Current Meds  Medication Sig   ASPIRIN 81 PO Take 81 mg by mouth daily.   Cholecalciferol (VITAMIN D) 2000 UNITS tablet Take 2,000 Units by mouth at bedtime.   niacin (NIASPAN) 1000 MG CR tablet Take 1,000 mg by mouth at bedtime.   sildenafil (VIAGRA) 25 MG tablet Take 25 mg by mouth daily as needed for erectile dysfunction.   simvastatin (ZOCOR) 20 MG tablet Take 20 mg by mouth at bedtime.    tiZANidine (ZANAFLEX) 4 MG capsule Take 4 mg by mouth every 8 (eight) hours.    [DISCONTINUED] aspirin 325 MG tablet Take 325 mg by mouth at bedtime.    [DISCONTINUED] gabapentin (NEURONTIN) 400 MG capsule Take 1 capsule (400 mg total) by mouth 3 (three) times daily.   [DISCONTINUED] HYDROcodone-acetaminophen (NORCO) 10-325 MG per tablet Take 1 tablet by mouth 2 (two) times daily as needed (pain).      No  Known Allergies  Social History   Socioeconomic History   Marital status: Married    Spouse name: Not on file   Number of children: Not on file   Years of education: Not on file   Highest education level: Not on file  Occupational History   Not on file  Tobacco Use   Smoking status: Never   Smokeless tobacco: Not on file  Substance and Sexual Activity   Alcohol use: Yes   Drug use: No   Sexual activity: Not on file  Other Topics Concern   Not on file  Social History Narrative   Not on file   Social Determinants of Health   Financial Resource Strain: Not on file  Food Insecurity: Not on file  Transportation Needs: Not on file  Physical Activity: Not on file  Stress: Not on file  Social Connections: Not on file  Intimate Partner Violence: Not on file     Review of Systems: General: negative for chills, fever, night sweats or weight changes.  Cardiovascular: negative for chest pain, dyspnea on exertion, edema, orthopnea, palpitations, paroxysmal nocturnal dyspnea or shortness of breath Dermatological: negative for rash Respiratory: negative for cough or wheezing Urologic: negative for hematuria Abdominal: negative for nausea, vomiting, diarrhea, bright red blood per rectum, melena, or hematemesis Neurologic: negative for visual changes, syncope, or dizziness All other systems reviewed and are otherwise negative except as noted above.    Blood pressure 124/60, pulse 83, height 6\' 1"  (1.854 m), weight 224 lb (101.6 kg).  General appearance: alert and no distress Neck: no adenopathy, no carotid bruit, no JVD, supple, symmetrical, trachea midline, and thyroid not enlarged, symmetric, no tenderness/mass/nodules Lungs: clear to auscultation bilaterally Heart: regular rate and rhythm, S1, S2 normal, no murmur, click, rub or gallop Extremities: extremities normal, atraumatic, no cyanosis or edema Pulses: 2+ and symmetric Skin: Skin color, texture, turgor normal. No rashes  or lesions Neurologic: Grossly normal  EKG sinus rhythm 83 with occasional PACs and without ST or T wave changes.  I personally reviewed this EKG.  ASSESSMENT AND PLAN:   Abnormal nuclear stress test Mr. Gupte had a Myoview stress test performed 09/14/2021 that showed inferior scar without ischemia.  His EF on that test was 45%.  He denies chest pain.  It certainly possible that his scar was the plane artifact/diaphragmatic attenuation.  His EF by 2D echo performed 08/25/2021 was normal without any mention of a wall motion abnormality in the inferior wall.  I am going to get a coronary CTA to further evaluate.  If this shows obstructive disease we will move towards cardiac catheterization.  Hyperlipidemia History of hyperlipidemia on simvastatin followed by his PCP     Lorretta Harp MD [facility], [facility] 09/26/2021 9:38 AM

## 2021-09-26 NOTE — Patient Instructions (Signed)
Medication Instructions:  Your physician recommends that you continue on your current medications as directed. Please refer to the Current Medication list given to you today.  *If you need a refill on your cardiac medications before your next appointment, please call your pharmacy*   Lab Work: Your physician recommends that you return for lab work in: within 7 days of coronary CTA: BMET  If you have labs (blood work) drawn today and your tests are completely normal, you will receive your results only by: MyChart Message (if you have MyChart) OR A paper copy in the mail If you have any lab test that is abnormal or we need to change your treatment, we will call you to review the results.   Testing/Procedures: See below.   Follow-Up: At [facility], you and your health needs are our priority.  As part of our continuing mission to provide you with exceptional heart care, we have created designated Provider Care Teams.  These Care Teams include your primary Cardiologist (physician) and Advanced Practice Providers (APPs -  Physician Assistants and Nurse Practitioners) who all work together to provide you with the care you need, when you need it.  We recommend signing up for the patient portal called "MyChart".  Sign up information is provided on this After Visit Summary.  MyChart is used to connect with patients for Virtual Visits (Telemedicine).  Patients are able to view lab/test results, encounter notes, upcoming appointments, etc.  Non-urgent messages can be sent to your provider as well.   To learn more about what you can do with MyChart, go to [email or web address].    Your next appointment:   To be determined.   Provider:   Nanetta Batty, MD   Other Instructions   Your cardiac CT will be scheduled at the below location:   [facility] [address] [place], [place] [address] [phone]   If scheduled at [facility], please arrive at the [facility] main entrance (entrance A) of [facility] 30 minutes prior to test start time. You can use the FREE valet parking offered at the main entrance (encouraged to control the heart rate for the test) Proceed to the [facility] Department (first floor) to check-in and test prep.   Please follow these instructions carefully (unless otherwise directed):  Hold all erectile dysfunction medications at least 3 days (72 hrs) prior to test.  On the Night Before the Test: Be sure to Drink plenty of water. Do not consume any caffeinated/decaffeinated beverages or chocolate 12 hours prior to your test.   On the Day of the Test: Drink plenty of water until 1 hour prior to the test. Do not eat any food 4 hours prior to the test. You may take your regular medications prior to the test.  Take metoprolol (Lopressor)100mg  two hours prior to test. HOLD Furosemide/Hydrochlorothiazide morning of the test.       After the Test: Drink plenty of water. After receiving IV contrast, you may experience a mild flushed feeling. This is normal. On occasion, you may experience a mild rash up to 24 hours after the test. This is not dangerous. If this occurs, you can take Benadryl 25 mg and increase your fluid intake. If you experience trouble breathing, this can be serious. If it is severe call 911 IMMEDIATELY. If it is mild, please call our office. If you take any of these medications: Glipizide/Metformin, Avandament, Glucavance, please do not take 48 hours after completing test unless  otherwise instructed.  We will call to schedule your test 2-4 weeks out understanding that some insurance companies will need an authorization prior to the service being performed.   For non-scheduling related questions, please contact the cardiac imaging nurse navigator should you have any questions/concerns: Rockwell [place], Cardiac Imaging Nurse Navigator Larey Brick, Cardiac Imaging Nurse Navigator [place]  [facility] Direct Office Dial: [phone]   For scheduling needs, including cancellations and rescheduling, please call [place], [phone].

## 2021-09-27 ENCOUNTER — Other Ambulatory Visit: Payer: Self-pay | Admitting: Cardiovascular Disease

## 2021-09-27 ENCOUNTER — Encounter (HOSPITAL_COMMUNITY): Payer: Self-pay | Admitting: *Deleted

## 2021-09-27 NOTE — Progress Notes (Signed)
Pt called radiology nurses station regarding upcoming lab work needed for CT Heart 10/06/2021. I advised that pt needs this lab work before CT scan on 10/06/21, pt verbalized understanding.

## 2021-10-05 ENCOUNTER — Telehealth (HOSPITAL_COMMUNITY): Payer: Self-pay | Admitting: *Deleted

## 2021-10-05 NOTE — Telephone Encounter (Signed)
Patient returning call regarding upcoming cardiac imaging study; pt verbalizes understanding of appt date/time, parking situation and where to check in, pre-test NPO status and medications ordered, and verified current allergies; name and call back number provided for further questions should they arise ? ?Larey Brick RN Navigator Cardiac Imaging ?[facility] ?[phone] office ?[phone] cell ? ?Patient to take 100mg  metoprolol tartrate two hours prior to his cardiac CT scan. He is aware to arrive at 8am for his 8:30am scan. ?

## 2021-10-05 NOTE — Telephone Encounter (Signed)
Attempted to call patient regarding upcoming cardiac CT appointment. °Left message on voicemail with name and callback number ° °Senie Lanese RN Navigator Cardiac Imaging °[facility] °[phone] Office °[phone] Cell ° °

## 2021-10-06 ENCOUNTER — Other Ambulatory Visit: Payer: Self-pay | Admitting: Cardiovascular Disease

## 2021-10-06 ENCOUNTER — Ambulatory Visit (HOSPITAL_COMMUNITY)
Admission: RE | Admit: 2021-10-06 | Discharge: 2021-10-06 | Disposition: A | Discharge status: Home / Self Care | Payer: Medicare HMO | Source: Ambulatory Visit | Attending: Cardiovascular Disease | Admitting: Cardiovascular Disease

## 2021-10-06 ENCOUNTER — Other Ambulatory Visit: Payer: Self-pay

## 2021-10-06 DIAGNOSIS — R079 Chest pain, unspecified: Secondary | ICD-10-CM

## 2021-10-06 DIAGNOSIS — R931 Abnormal findings on diagnostic imaging of heart and coronary circulation: Secondary | ICD-10-CM | POA: Insufficient documentation

## 2021-10-06 DIAGNOSIS — R9439 Abnormal result of other cardiovascular function study: Secondary | ICD-10-CM | POA: Insufficient documentation

## 2021-10-06 DIAGNOSIS — E782 Mixed hyperlipidemia: Secondary | ICD-10-CM | POA: Insufficient documentation

## 2021-10-06 DIAGNOSIS — I4729 Other ventricular tachycardia: Secondary | ICD-10-CM | POA: Diagnosis present

## 2021-10-06 DIAGNOSIS — I2510 Atherosclerotic heart disease of native coronary artery without angina pectoris: Secondary | ICD-10-CM | POA: Diagnosis not present

## 2021-10-06 IMAGING — CT CT HEART MORP W/ CTA COR W/ SCORE W/ CA W/CM &/OR W/O CM
4 of 7 series · 8 of 20 positions shown, 9 images · IV contrast (APPLIED)
Comparison: None.
COMPARISON: None.

Addendum:
EXAM:
OVER-READ INTERPRETATION  CT CHEST

The following report is an over-read performed by radiologist Dr.
Niiger Rapers [REDACTED] on 10/06/2021. This
over-read does not include interpretation of cardiac or coronary
anatomy or pathology. The coronary calcium score/coronary CTA
interpretation by the cardiologist is attached.
CLINICAL DATA: 70M with hyperlipidemia, abnormal stress test and
NSVT.
Cardiac/Coronary  CT
TECHNIQUE: The patient was scanned on a Phillips Force scanner.

[Series 6: ts diast sharp · axial · 0.39mm/px · z∈[+1400,+1438]mm · 2 of 286 slices shown]
[im 96/286  lung]
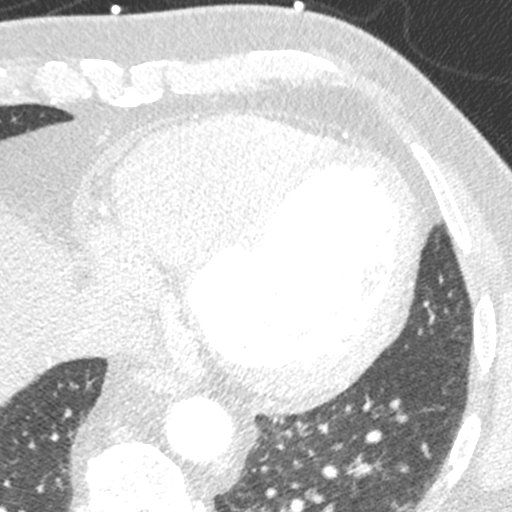
[im 191/286  lung]
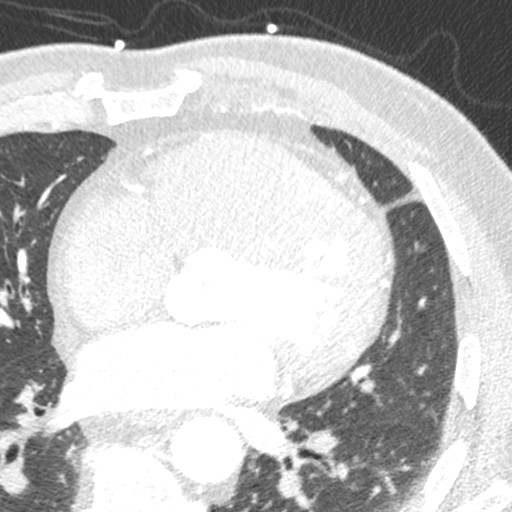

[Series 7: ts syst sharp · axial · 0.39mm/px · z∈[+1400,+1438]mm · 2 of 286 slices shown]
[im 96/286  lung]
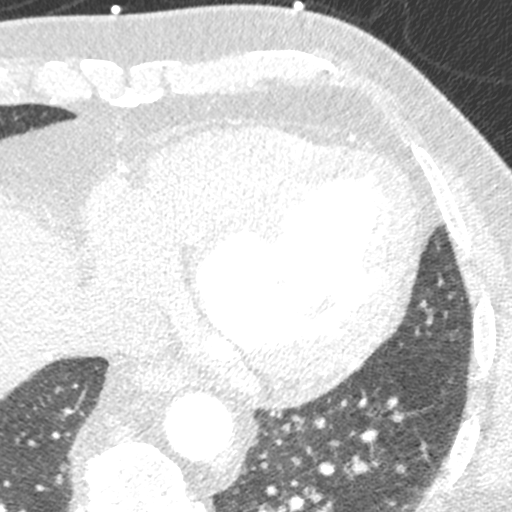
[im 191/286  lung]
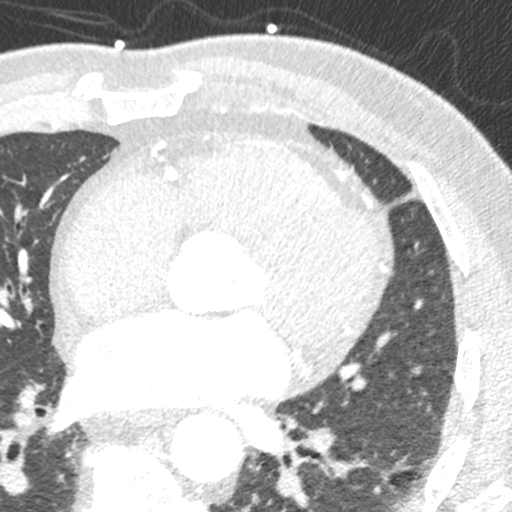

[Series 8: best syst · axial · 0.39mm/px · z∈[+1400,+1438]mm · 2 of 286 slices shown, 3 images]
[im 96/286  vessel]
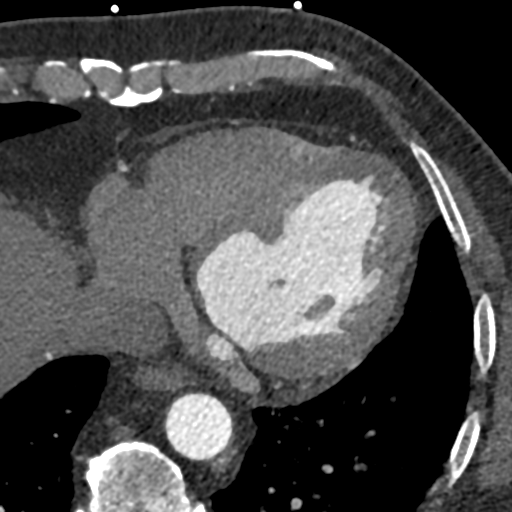
[im 96/286  lung]
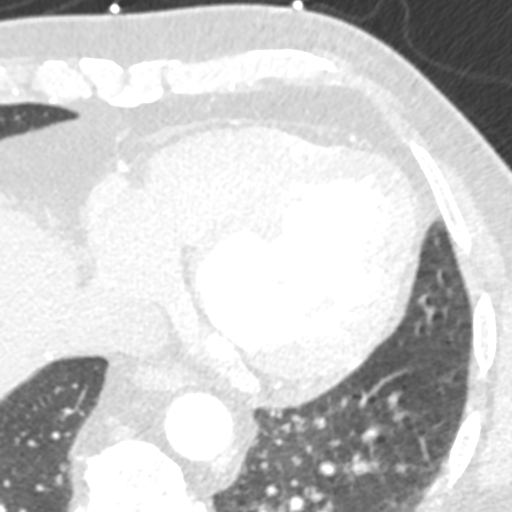
[im 191/286  vessel]
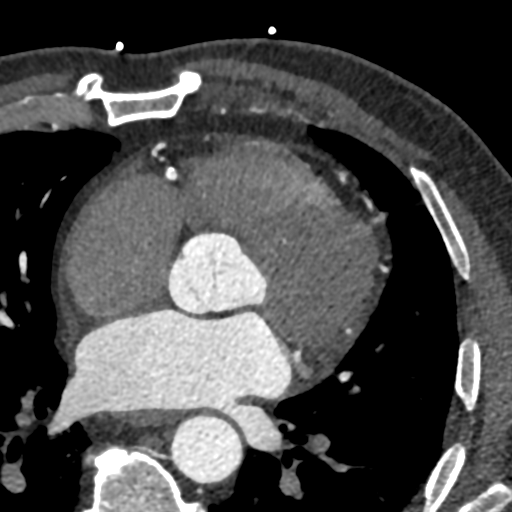

[Series 9: best diast · axial · 0.39mm/px · z∈[+1400,+1438]mm · 2 of 286 slices shown]
[im 96/286  vessel]
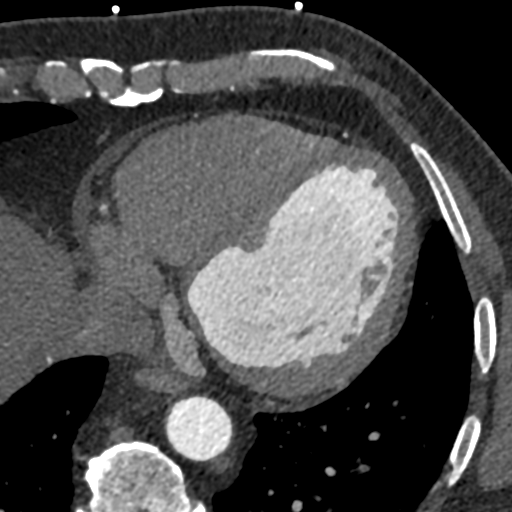
[im 191/286  vessel]
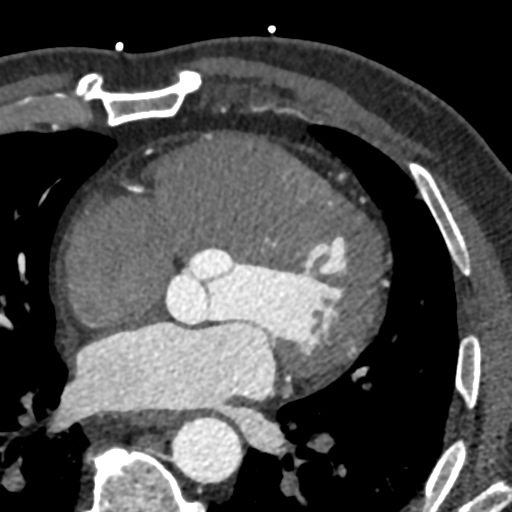

[8 of 20 positions shown; findings below may reference images not displayed]

FINDINGS: Vascular: Scattered aortic atherosclerosis. Visualized portions of
the thoracic aorta are normal in caliber. Normal caliber main
pulmonary artery.

Mediastinum/Nodes: Prominent subcarinal lymph node measures 7 mm in
short axis on image [DATE]. No pathologically enlarged mediastinal or
hilar lymph nodes identified. Visualized portions of the esophagus
are unremarkable.

Lungs/Pleura: Hypoventilatory change in the lung bases. No
suspicious pulmonary nodules or masses. No pleural effusion. No
pneumothorax.

Upper Abdomen: No acute abnormality on this very limited view of the
upper abdomen.

Musculoskeletal: Thoracic diffuse idiopathic skeletal hyperostosis.
IMPRESSION: 1. No acute extracardiac findings.
2.  Aortic Atherosclerosis (GNPYK-ZFZ.Z).
FINDINGS: A 120 kV prospective scan was triggered in the descending thoracic
aorta at 111 HU's. Axial non-contrast 3 mm slices were carried out
through the heart. The data set was analyzed on a dedicated work
station and scored using the Agatson method. Gantry rotation speed
was 250 msecs and collimation was .6 mm. No beta blockade and 0.8 mg
of sl NTG was given. The 3D data set was reconstructed in 5%
intervals of the 67-82 % of the R-R cycle. Diastolic phases were
analyzed on a dedicated work station using MPR, MIP and VRT modes.
The patient received 80 cc of contrast.

Aorta: Normal size. Ascending aorta 3.0 cm. No calcifications. No
dissection.

Aortic Valve:  Trileaflet.  No calcifications.

Coronary Arteries:  Normal coronary origin.  Right dominance.

RCA is a large dominant artery that gives rise to PDA and PLVB.
There is mild (25-49%) calcified plaque proximally. There is
moderate (50-69%) mixed plaque in the mid RCA and severe (>70%)
mixed plaque distally.

Left main is a large artery that gives rise to LAD and LCX arteries.
There is minimal (< 25%) calcified plaque.

LAD is a large vessel that has moderate (50-69%) mixed plaque
proximally and severe (> 70%) mixed plaque in the mid LAD. There is
mild (25-49%) calcified plaque in the distal LAD. D1 has severe
(>70%) mixed plaque.

LCX is a non-dominant artery. There is mild (25-49%) calcified
plaque. OM1 has minimal (<25%) calcified plaque.

Coronary Calcium Score:

Left main: 100

Left anterior descending artery: 790

Left circumflex artery:

Right coronary artery: 258

Total: 6605

Percentile: 95th

Other findings:

Normal pulmonary vein drainage into the left atrium.

Normal let atrial appendage without a thrombus.

Normal size of the pulmonary artery.
IMPRESSION: 1. Coronary calcium score of 6605. This was 95th percentile for
age-, race-, and sex-matched controls.

2. Normal coronary origin with right dominance.

3. There is severe (70%) plaque in the RCA, LAD, and T1. There is
mild (25-49%) plaque in the LCX. CAD-RADS 4.

4.  Will send for FFRct.

*** End of Addendum ***
EXAM:
OVER-READ INTERPRETATION  CT CHEST

The following report is an over-read performed by radiologist Dr.
Niiger Rapers [REDACTED] on 10/06/2021. This
over-read does not include interpretation of cardiac or coronary
anatomy or pathology. The coronary calcium score/coronary CTA
interpretation by the cardiologist is attached.
FINDINGS: Vascular: Scattered aortic atherosclerosis. Visualized portions of
the thoracic aorta are normal in caliber. Normal caliber main
pulmonary artery.

Mediastinum/Nodes: Prominent subcarinal lymph node measures 7 mm in
short axis on image [DATE]. No pathologically enlarged mediastinal or
hilar lymph nodes identified. Visualized portions of the esophagus
are unremarkable.

Lungs/Pleura: Hypoventilatory change in the lung bases. No
suspicious pulmonary nodules or masses. No pleural effusion. No
pneumothorax.

Upper Abdomen: No acute abnormality on this very limited view of the
upper abdomen.

Musculoskeletal: Thoracic diffuse idiopathic skeletal hyperostosis.
IMPRESSION: 1. No acute extracardiac findings.
2.  Aortic Atherosclerosis (GNPYK-ZFZ.Z).

## 2021-10-06 MED ORDER — NITROGLYCERIN 0.4 MG SL SUBL
SUBLINGUAL_TABLET | SUBLINGUAL | Status: AC
  Filled 2021-10-06: qty 2

## 2021-10-06 MED ORDER — NITROGLYCERIN 0.4 MG SL SUBL
0.8000 mg | SUBLINGUAL_TABLET | Freq: Once | SUBLINGUAL | Status: AC
  Administered 2021-10-06: 0.8 mg via SUBLINGUAL

## 2021-10-06 MED ORDER — IOHEXOL 350 MG/ML SOLN
95.0000 mL | Freq: Once | INTRAVENOUS | Status: AC | PRN
  Administered 2021-10-06: 95 mL via INTRAVENOUS

## 2021-10-06 NOTE — Progress Notes (Signed)
Amy PA in to see patient. Spoke to and examined patient. He may be discharged ?

## 2021-10-06 NOTE — Progress Notes (Signed)
Ready to discharge patient when he was looking at his Left thumb and index finger saying they are a little swollen, no pain. Denies SOB and itching and feeling of swelling elsewhere. No hives noted on abd or back per [facility] CT tech. Will let radiologist know out of precaution. ?

## 2021-10-06 NOTE — Progress Notes (Signed)
Received a call for possible allergic reaction.  ? ?Patient sitting on the side of CT table, NAD.  ?Able to speak in full sentence w/o difficulty.  ?States that he noticed mild swelling on his left thumb (mostly at MCP joint) after the CT scan.  ?He states that the swelling has gone down now.  ?No other complaints such as itchiness on thumb or throat, dyspnea, thumb pain, decreased ROM.  ?VSS.  ?PE mild edema on left thumb but unremarkable otherwise, no wheezing, no angioedema.  ? ?Discussed with the patient, it does not seem like the thumb swelling is directly relates to allergic reaction, and as patient appears to be hemodynamically stable, ok to be discharged home.  ?Encourage the patient to seek medical attention is the swelling worsens, or if he develops other allergic reactions.  ?Patient verbalized understanding.  ? ?OK to be discharged home.  ? ?Willette Brace PA-C ?10/06/2021 9:21 AM ? ?  ? ?

## 2021-10-17 ENCOUNTER — Ambulatory Visit (INDEPENDENT_AMBULATORY_CARE_PROVIDER_SITE_OTHER): Payer: Medicare HMO | Admitting: Cardiovascular Disease

## 2021-10-17 ENCOUNTER — Other Ambulatory Visit: Payer: Self-pay

## 2021-10-17 ENCOUNTER — Encounter: Payer: Self-pay | Admitting: Cardiovascular Disease

## 2021-10-17 VITALS — BP 150/74 | HR 81 | Ht 73.0 in | Wt 229.8 lb

## 2021-10-17 DIAGNOSIS — R079 Chest pain, unspecified: Secondary | ICD-10-CM | POA: Diagnosis not present

## 2021-10-17 DIAGNOSIS — I4729 Other ventricular tachycardia: Secondary | ICD-10-CM | POA: Diagnosis not present

## 2021-10-17 DIAGNOSIS — E782 Mixed hyperlipidemia: Secondary | ICD-10-CM | POA: Diagnosis not present

## 2021-10-17 NOTE — Patient Instructions (Signed)

## 2021-10-17 NOTE — Progress Notes (Signed)
Norman Arroyo returns today for follow-up of his coronary CTA/FFR.  This was performed in the evaluation of an abnormal Myoview stress test and nonsustained ventricular tachycardia on an event monitor.  He is totally asymptomatic.  He specifically denies syncope, presyncope, chest pain or shortness of breath.  His 2D echo was normal and specifically showed no wall motion abnormality.  The 6 coronary CTA revealed an elevated coronary calcium score of 1198 with what appears to be a significant diagonal branch lesion.  The main vessels were negative by FFR although Dr. Oval Linsey, the reading cardiologist, did suggest cardiac catheterization.  In the absence of symptoms, I am hesitant to move forward with this.  I did refer the patient to Dr. Caryl Comes for further EP evaluation and await his input.  I discussed this with [name] PA-see, the referring cardiologist as well. ? ?Lorretta Harp, M.D., [place], [facility], [place], [place] ?[address] ?[place], [place]  [address]  ?[phone] ?10/17/2021 ?8:38 AM  ?

## 2021-11-06 ENCOUNTER — Ambulatory Visit: Payer: Medicare HMO | Admitting: Internal Medicine

## 2021-11-06 ENCOUNTER — Encounter: Payer: Self-pay | Admitting: Internal Medicine

## 2021-11-06 VITALS — Ht 73.0 in | Wt 232.0 lb

## 2021-11-06 DIAGNOSIS — I4729 Other ventricular tachycardia: Secondary | ICD-10-CM

## 2021-11-06 NOTE — Progress Notes (Signed)
? ? ? ? ?ELECTROPHYSIOLOGY CONSULT NOTE  ?Patient ID: Norman Arroyo, MRN: [id number], DOB/AGE: 08/18/1950 71 y.o. ?Admit date: (Not on file) ?Date of Consult: 11/06/2021 ? ?Primary Physician: Ralene Ok, MD ?Primary Cardiologist: JB ?  ?  ?Kamarii Carton is a 71 y.o. male who is being seen today for the evaluation of VT-nonsustained at the request of Dr. Dorma Russell.  ? ? ?HPI ?Schneider Warchol is a 71 y.o. male referred to [facility] by M-Duran-PA-C because of irregularities.   ?An event recorder had demonstrated 2 episodes of nonsustained ventricular tachycardia the first was  4 beats long and the second was 15 beats long.  No associated symptoms.  Although he does have some history of dizziness, this is largely with prolonged standing. ? ?He had undergone an evaluation concerning for structural heart disease with an abnormal Myoview finding again the possibility of scar which has been suggested in prior testing.  Echocardiogram had shown no wall motion abnormalities.  The patient underwent CTA with results as noted below. ? ?No chest pain.  Mild dyspnea on exertion.  No edema.  No palpitations or syncope is noted  ? ?DATE TEST EF   ?1/23 Myoview     % Inferior scar without ischemia  ?1/23 Echo   55-60 %   ?3/23 CTA  FFR D1 0.65 ?FFR not able to be mild on the RCA visually associate with significant disease  ? ?Date Cr K Hgb  ?3/23 0.83 4.0 14.0  ?      ? ? ? ? ?Past Medical History:  ?Diagnosis Date  ? Achilles bursitis or tendinitis   ? Calcaneal spur   ? Corns and callosities   ? Disturbance of skin sensation   ? Encounter for long-term (current) use of other medications   ? Foot and toe(s), insect bite, nonvenomous, without mention of infection   ? Hypertension   ? Lumbago   ? Nonspecific abnormal electrocardiogram (ECG) (EKG)   ? Other abnormal glucose 272.4  ? Other and unspecified hyperlipidemia   ? Pain in joint, pelvic region and thigh   ? Sprain of lumbar region   ? Sprain of thoracic region   ?   ? ?Surgical  History:  ?Past Surgical History:  ?Procedure Laterality Date  ? BACK SURGERY    ?  ? ?Home Meds: ?Current Meds  ?Medication Sig  ? ASPIRIN 81 PO Take 81 mg by mouth daily.  ? Cholecalciferol (VITAMIN D) 2000 UNITS tablet Take 2,000 Units by mouth at bedtime.  ? cyanocobalamin (,VITAMIN B-12,) 1000 MCG/ML injection Inject into the muscle every 30 (thirty) days.  ? diclofenac Sodium (VOLTAREN) 1 % GEL as needed for pain.  ? naproxen (NAPROSYN) 500 MG tablet Take 500 mg by mouth as needed for pain.  ? niacin 500 MG tablet Take by mouth in the morning and at bedtime.  ? sildenafil (VIAGRA) 25 MG tablet Take 25 mg by mouth daily as needed for erectile dysfunction.  ? simvastatin (ZOCOR) 20 MG tablet Take 20 mg by mouth at bedtime.   ? tiZANidine (ZANAFLEX) 4 MG capsule Take 4 mg by mouth every 8 (eight) hours.   ? [DISCONTINUED] niacin (NIASPAN) 1000 MG CR tablet Take 1,000 mg by mouth at bedtime.  ? [DISCONTINUED] sildenafil (VIAGRA) 50 MG tablet Take by mouth as needed for erectile dysfunction.  ? [DISCONTINUED] tiZANidine (ZANAFLEX) 2 MG tablet Take 2 mg by mouth at bedtime as needed.  ? ? ?Allergies: No Known Allergies ? ?Social History  ? ?  Socioeconomic History  ? Marital status: Married  ?  Spouse name: Not on file  ? Number of children: Not on file  ? Years of education: Not on file  ? Highest education level: Not on file  ?Occupational History  ? Not on file  ?Tobacco Use  ? Smoking status: Never  ? Smokeless tobacco: Not on file  ?Substance and Sexual Activity  ? Alcohol use: Yes  ? Drug use: No  ? Sexual activity: Not on file  ?Other Topics Concern  ? Not on file  ?Social History Narrative  ? Not on file  ? ?Social Determinants of Health  ? ?Financial Resource Strain: Not on file  ?Food Insecurity: Not on file  ?Transportation Needs: Not on file  ?Physical Activity: Not on file  ?Stress: Not on file  ?Social Connections: Not on file  ?Intimate Partner Violence: Not on file  ?  ? ?No family history on file.   ? ?ROS:  Please see the history of present illness.     All other systems reviewed and negative.  ? ? ?Physical Exam: ?Blood pressure 126/72, pulse 70, height 6\' 1"  (1.854 m), weight 232 lb (105.2 kg), SpO2 96 %. ?General: Well developed, well nourished male in no acute distress. ?Head: Normocephalic, atraumatic, sclera non-icteric, no xanthomas, nares are without discharge. ?EENT: normal  ?Lymph Nodes:  none ?Neck: Negative for carotid bruits. JVD not elevated. ?Back:without scoliosis kyphosis ?Lungs: Clear bilaterally to auscultation without wheezes, rales, or rhonchi. Breathing is unlabored. ?Heart: RRR with S1 S2. No   murmur . No rubs, or gallops appreciated. ?Abdomen: Soft, non-tender, non-distended with normoactive bowel sounds. No hepatomegaly. No rebound/guarding. No obvious abdominal masses. ?Msk:  Strength and tone appear normal for age. ?Extremities: No clubbing or cyanosis. No  edema.  Distal pedal pulses are 2+ and equal bilaterally. ?Skin: Warm and Dry ?Neuro: Alert and oriented X 3. CN III-XII intact Grossly normal sensory and motor function . ?Psych:  Responds to questions appropriately with a normal affect. ?  ?  ?  ? ?EKG: Reviewed from 09/26/2021 sinus at 83 with frequent PACs nonspecific ST changes ? ? ?Assessment and Plan:  ?Ventricular tachycardia nonsustained ? ?Coronary artery disease with positive FFR and possible prior MI ? ?Palpitations with known PACs and PVCs ? ?Orthostatic lightheadedness ? ? ?The patient has nonsustained ventricular tachycardia in the setting of nearly normal LV function.  The concerns in this regard are whether it is a marker for more significant underlying structural disease, and in this case there is a high pretest probability that there is concern for ischemia given his positive FFR on his CT scan.  I will review this with Dr. 09/28/2021; in this regard noteworthy that the patient's ongoing and protracted evaluation has been associate with significant amount of anxiety  and concern about the underlying possibilities. ? ?He has no palpitations.  My inclination would be to use low-dose beta-blockers broadly for him. ? ?We will measure orthostatic blood pressures given his lightheadedness but these were surprisingly normal ? ? ? ?Dorma Russell  ?

## 2021-11-06 NOTE — Patient Instructions (Addendum)
Medication Instructions:  ?Your physician recommends that you continue on your current medications as directed. Please refer to the Current Medication list given to you today. ? ?Labwork: ?None ordered. ? ?Testing/Procedures: ?None ordered. ? ?Follow-Up: ?Your physician wants you to follow-up in: one year with Dr. Graciela Husbands or one of the following Advanced Practice Providers on your designated Care Team:   ?Francis Dowse, PA-C ?Casimiro Needle "Mardelle Matte" [place], PA-C ? ?RAny Other Special Instructions Will Be Listed Below (If Applicable). ? ?If you need a refill on your cardiac medications before your next appointment, please call your pharmacy.  ? ? ? ? ?

## 2021-11-09 ENCOUNTER — Telehealth: Payer: Self-pay | Admitting: Internal Medicine

## 2021-11-09 NOTE — Telephone Encounter (Signed)
Called and spoke to wife Norman Arroyo regarding patient message as pt was out of the house. Informed her that I couldn't give a lot of detail since we didn't have her on DPR, but that pt had called questioning testing that was to be done by Dr Caryl Comes for Dr Gwenlyn Found. Cannot find where Dr Caryl Comes had advised any testing and was only evaluating pt for Dr Gwenlyn Found. Advised that he reach out to Dr Kennon Holter office. Wife will ensure pt does this. ?

## 2021-11-09 NOTE — Telephone Encounter (Signed)
?  Per MyChart scheduling message: ? ?Dr Sherryl Manges said he was getting in touch with Dr Allyson Sabal for me to be tested that's what I thought September 5 appt was maybe he hasn't contacted DrBerry yet. I would like to get test sooner than later. ?

## 2022-01-31 ENCOUNTER — Ambulatory Visit: Payer: Medicare HMO | Admitting: Cardiovascular Disease

## 2022-01-31 ENCOUNTER — Encounter: Payer: Self-pay | Admitting: Cardiovascular Disease

## 2022-01-31 DIAGNOSIS — I4729 Other ventricular tachycardia: Secondary | ICD-10-CM | POA: Diagnosis not present

## 2022-01-31 DIAGNOSIS — R9439 Abnormal result of other cardiovascular function study: Secondary | ICD-10-CM

## 2022-01-31 DIAGNOSIS — E782 Mixed hyperlipidemia: Secondary | ICD-10-CM

## 2022-01-31 DIAGNOSIS — R931 Abnormal findings on diagnostic imaging of heart and coronary circulation: Secondary | ICD-10-CM | POA: Insufficient documentation

## 2022-01-31 DIAGNOSIS — I10 Essential (primary) hypertension: Secondary | ICD-10-CM | POA: Diagnosis not present

## 2022-01-31 MED ORDER — METOPROLOL SUCCINATE ER 25 MG PO TB24: 25.0000 mg | ORAL_TABLET | Freq: Every day | ORAL | 3 refills | Status: DC

## 2022-01-31 NOTE — Assessment & Plan Note (Signed)
Essential hypertension a blood pressure in his left arm measuring 130/77.  He is not on antihypertensive medications.

## 2022-01-31 NOTE — Assessment & Plan Note (Signed)
History of hyperlipidemia apparently on simvastatin recently but this was discontinued because of myalgias.  He was placed on another statin drug.  We will recheck a lipid liver profile today in light of his recent coronary calcium score of 1198.  His LDL goal is less than 70.

## 2022-01-31 NOTE — Progress Notes (Signed)
Allyson Sabal    01/31/2022 [name]   June 25, 1951  [id number]  Primary Physician Ralene Ok, MD Primary Cardiologist: Runell Gess MD Nicholes Calam[place], [place]  HPI:  Norman Arroyo is a 71 y.o.  thin-appearing married African-American male father of 3 children, grandfather of 4 grandchildren referred by Arnette Felts, PA-C for cardiac catheterization because of an abnormal Myoview stress test and nonsustained ventricular tachycardia.  I last saw him in the office 09/26/2021.  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  His risk factors include discontinue tobacco abuse 23 years ago as well as treated hyperlipidemia.  There is no family history for heart disease.  Is never had a heart attack or stroke.  Denies chest pain or shortness of breath.  He apparently had runs of nonsustained ventricular tachycardia on a 30-day event monitor although he denies palpitations.  A Myoview stress test performed 09/14/2021 showed inferior scar without ischemia and a 2D echo performed 08/25/2021 revealed normal LV function without mention of the wall motion abnormality.  He was referred for diagnostic coronary angiography.  Since I saw him 4 months ago he has seen Dr. Graciela Husbands, electrophysiology,/3/23 for his nonsustained ventricular tachycardia.  Dr. Graciela Husbands recommended low-dose beta-blocker.  I performed a coronary CTA revealing a coronary calcium score of 1198 with FFR analysis suggesting significant disease in the first diagonal branch.  Patient is totally asymptomatic.   Current Meds  Medication Sig   ASPIRIN 81 PO Take 81 mg by mouth daily.   Cholecalciferol (VITAMIN D) 2000 UNITS tablet Take 2,000 Units by mouth at bedtime.   cyanocobalamin (,VITAMIN B-12,) 1000 MCG/ML injection Inject into the muscle every 30 (thirty) days.   diclofenac Sodium (VOLTAREN) 1 % GEL as needed for pain.   naproxen (NAPROSYN) 500 MG tablet Take 500 mg by mouth as needed for pain.   niacin 500  MG tablet Take by mouth in the morning and at bedtime.   sildenafil (VIAGRA) 25 MG tablet Take 25 mg by mouth daily as needed for erectile dysfunction.   tiZANidine (ZANAFLEX) 4 MG capsule Take 4 mg by mouth every 8 (eight) hours.      No Known Allergies  Social History   Socioeconomic History   Marital status: Married    Spouse name: Not on file   Number of children: Not on file   Years of education: Not on file   Highest education level: Not on file  Occupational History   Not on file  Tobacco Use   Smoking status: Never   Smokeless tobacco: Not on file  Substance and Sexual Activity   Alcohol use: Yes   Drug use: No   Sexual activity: Not on file  Other Topics Concern   Not on file  Social History Narrative   Not on file   Social Determinants of Health   Financial Resource Strain: Not on file  Food Insecurity: Not on file  Transportation Needs: Not on file  Physical Activity: Not on file  Stress: Not on file  Social Connections: Not on file  Intimate Partner Violence: Not on file     Review of Systems: General: negative for chills, fever, night sweats or weight changes.  Cardiovascular: negative for chest pain, dyspnea on exertion, edema, orthopnea, palpitations, paroxysmal nocturnal dyspnea or shortness of breath Dermatological: negative for rash Respiratory: negative for cough or wheezing Urologic: negative for hematuria Abdominal: negative for nausea, vomiting, diarrhea, bright red blood per rectum, melena, or hematemesis  Neurologic: negative for visual changes, syncope, or dizziness All other systems reviewed and are otherwise negative except as noted above.    Blood pressure 130/77, pulse 72, height 6\' 1"  (1.854 m), weight 226 lb 9.6 oz (102.8 kg), SpO2 98 %.  General appearance: alert and no distress Neck: no adenopathy, no carotid bruit, no JVD, supple, symmetrical, trachea midline, and thyroid not enlarged, symmetric, no tenderness/mass/nodules Lungs:  clear to auscultation bilaterally Heart: regular rate and rhythm, S1, S2 normal, no murmur, click, rub or gallop Extremities: extremities normal, atraumatic, no cyanosis or edema Pulses: 2+ and symmetric Skin: Skin color, texture, turgor normal. No rashes or lesions Neurologic: Grossly normal  EKG not performed today  ASSESSMENT AND PLAN:   HTN (hypertension) Essential hypertension a blood pressure in his left arm measuring 130/77.  He is not on antihypertensive medications.  Hyperlipidemia History of hyperlipidemia apparently on simvastatin recently but this was discontinued because of myalgias.  He was placed on another statin drug.  We will recheck a lipid liver profile today in light of his recent coronary calcium score of 1198.  His LDL goal is less than 70.  Abnormal nuclear stress test Myoview stress test apparently showed inferior scar although 2D echo showed normal LV function without wall motion abnormality.  He does have disease in his RCA by coronary CTA although he is completely asymptomatic.  At this point, we will treat him medically.  Nonsustained ventricular tachycardia (HCC) History of nonsustained reticular tachycardia which he is unaware of.  He did see Dr. who suggested a low-dose beta-blocker especially in light of his normal LV function.  The question still remains whether or not to perform coronary angiography on him given his diffuse CAD by coronary CTA although he denies chest pain.  At this point, I am inclined just to treat him medically/conservatively.  Elevated coronary artery calcium score Coronary calcium score was 1198 with disease in all 3 coronary arteries.  FFR analysis showed obstructive disease in D1 although disease in the RCA distally could not be ruled out.     Graciela Husbands MD FACP,FACC,FAHA, [facility] 01/31/2022 9:58 AM

## 2022-01-31 NOTE — Assessment & Plan Note (Signed)
Coronary calcium score was 1198 with disease in all 3 coronary arteries.  FFR analysis showed obstructive disease in D1 although disease in the RCA distally could not be ruled out.

## 2022-01-31 NOTE — Patient Instructions (Signed)
Medication Instructions:   Your physician has recommended you make the following change in your medication:  -Start metoprolol succinate (toprol-xl) 25mg  once daily  *If you need a refill on your cardiac medications before your next appointment, please call your pharmacy*   Lab Work: Your physician recommends that you have labs drawn today: Lipid/liver profile   If you have labs (blood work) drawn today and your tests are completely normal, you will receive your results only by: MyChart Message (if you have MyChart) OR A paper copy in the mail If you have any lab test that is abnormal or we need to change your treatment, we will call you to review the results.   Follow-Up: At [facility], you and your health needs are our priority.  As part of our continuing mission to provide you with exceptional heart care, we have created designated Provider Care Teams.  These Care Teams include your primary Cardiologist (physician) and Advanced Practice Providers (APPs -  Physician Assistants and Nurse Practitioners) who all work together to provide you with the care you need, when you need it.  We recommend signing up for the patient portal called "MyChart".  Sign up information is provided on this After Visit Summary.  MyChart is used to connect with patients for Virtual Visits (Telemedicine).  Patients are able to view lab/test results, encounter notes, upcoming appointments, etc.  Non-urgent messages can be sent to your provider as well.   To learn more about what you can do with MyChart, go to [facility].    Your next appointment:   6 month(s)  The format for your next appointment:   In Person  Provider:   [email or web address], MD

## 2022-01-31 NOTE — Assessment & Plan Note (Signed)
Myoview stress test apparently showed inferior scar although 2D echo showed normal LV function without wall motion abnormality.  He does have disease in his RCA by coronary CTA although he is completely asymptomatic.  At this point, we will treat him medically.

## 2022-01-31 NOTE — Assessment & Plan Note (Signed)
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  He did see Dr. Graciela Husbands who suggested a low-dose beta-blocker especially in light of his normal LV function.  The question still remains whether or not to perform coronary angiography on him given his diffuse CAD by coronary CTA although he denies chest pain.  At this point, I am inclined just to treat him medically/conservatively.

## 2022-02-01 LAB — LIPID PANEL
Chol/HDL Ratio: 4.2 ratio (ref 0.0–5.0)
Cholesterol, Total: 160 mg/dL (ref 100–199)
HDL: 38 mg/dL — ABNORMAL LOW (ref >39)
LDL Chol Calc (NIH): 105 mg/dL — ABNORMAL HIGH (ref 0–99)
Triglycerides: 88 mg/dL (ref 0–149)
VLDL Cholesterol Cal: 17 mg/dL (ref 5–40)

## 2022-02-01 LAB — HEPATIC FUNCTION PANEL
ALT: 32 IU/L (ref 0–44)
AST: 27 IU/L (ref 0–40)
Albumin: 4.4 g/dL (ref 3.7–4.7)
Alkaline Phosphatase: 98 IU/L (ref 44–121)
Bilirubin Total: 0.4 mg/dL (ref 0.0–1.2)
Bilirubin, Direct: 0.12 mg/dL (ref 0.00–0.40)
Total Protein: 7.9 g/dL (ref 6.0–8.5)

## 2022-02-08 ENCOUNTER — Telehealth: Payer: Self-pay

## 2022-02-08 DIAGNOSIS — R931 Abnormal findings on diagnostic imaging of heart and coronary circulation: Secondary | ICD-10-CM

## 2022-02-08 DIAGNOSIS — E782 Mixed hyperlipidemia: Secondary | ICD-10-CM

## 2022-02-08 NOTE — Telephone Encounter (Signed)
Spoke with pt regarding recent lab work and Dr. Hazle Coca recommendations. Pt states that he forgot to tell us about a new cholesterol medication that he was prescribed by Dr. Annabell Sabal, with [facility], zetia 10mg  once daily. Pt states that he has only been taking it for a few weeks now. We will recheck cholesterol levels in 3 months to see how zetia is working. Pt is agreeable to PCSK9 if medication does not get LDL below 70. Lab orders placed and mailed to pt's home address. Pt verbalizes understanding.

## 2022-02-08 NOTE — Telephone Encounter (Signed)
-----   Message from Runell Gess, MD sent at 02/02/2022  4:36 PM EDT ----- LDL 105.  Unclear whether he is taking a statin drug.  He had side effects from simvastatin.  He may need to be on a PCSK9.  Please have him follow-up with Pharm.D. to further evaluate.

## 2022-04-10 ENCOUNTER — Ambulatory Visit: Payer: Medicare HMO | Admitting: Cardiovascular Disease

## 2022-07-11 ENCOUNTER — Encounter: Payer: Self-pay | Admitting: Cardiovascular Disease

## 2022-07-11 ENCOUNTER — Ambulatory Visit: Payer: Medicare HMO | Attending: Cardiovascular Disease | Admitting: Cardiovascular Disease

## 2022-07-11 VITALS — BP 120/64 | HR 72 | Ht 73.0 in | Wt 216.2 lb

## 2022-07-11 DIAGNOSIS — E782 Mixed hyperlipidemia: Secondary | ICD-10-CM | POA: Diagnosis not present

## 2022-07-11 DIAGNOSIS — I10 Essential (primary) hypertension: Secondary | ICD-10-CM | POA: Diagnosis not present

## 2022-07-11 DIAGNOSIS — R931 Abnormal findings on diagnostic imaging of heart and coronary circulation: Secondary | ICD-10-CM

## 2022-07-11 DIAGNOSIS — I4729 Other ventricular tachycardia: Secondary | ICD-10-CM | POA: Diagnosis not present

## 2022-07-11 LAB — HEPATIC FUNCTION PANEL
ALT: 31 IU/L (ref 0–44)
AST: 29 IU/L (ref 0–40)
Albumin: 4.4 g/dL (ref 3.8–4.8)
Alkaline Phosphatase: 110 IU/L (ref 44–121)
Bilirubin Total: 0.4 mg/dL (ref 0.0–1.2)
Bilirubin, Direct: 0.12 mg/dL (ref 0.00–0.40)
Total Protein: 7.6 g/dL (ref 6.0–8.5)

## 2022-07-11 LAB — LIPID PANEL
Chol/HDL Ratio: 3.9 ratio (ref 0.0–5.0)
Cholesterol, Total: 162 mg/dL (ref 100–199)
HDL: 42 mg/dL (ref >39)
LDL Chol Calc (NIH): 100 mg/dL — ABNORMAL HIGH (ref 0–99)
Triglycerides: 107 mg/dL (ref 0–149)
VLDL Cholesterol Cal: 20 mg/dL (ref 5–40)

## 2022-07-11 NOTE — Assessment & Plan Note (Signed)
History of essential hypertension a blood pressure measured today 120/64.  He is on metoprolol.

## 2022-07-11 NOTE — Patient Instructions (Signed)
Medication Instructions:  Your physician recommends that you continue on your current medications as directed. Please refer to the Current Medication list given to you today.  *If you need a refill on your cardiac medications before your next appointment, please call your pharmacy*   Lab Work: Your physician recommends that you have labs drawn today: Lipid/liver profile  If you have labs (blood work) drawn today and your tests are completely normal, you will receive your results only by: MyChart Message (if you have MyChart) OR A paper copy in the mail If you have any lab test that is abnormal or we need to change your treatment, we will call you to review the results.   Testing/Procedures: Your physician has requested that you have a lower extremity arterial duplex. During this test, ultrasound is used to evaluate arterial blood flow in the legs. Allow one hour for this exam. There are no restrictions or special instructions. This will take place at [address] [facility], [address].  Your physician has requested that you have an ankle brachial index (ABI). During this test an ultrasound and blood pressure cuff are used to evaluate the arteries that supply the arms and legs with blood. Allow thirty minutes for this exam. There are no restrictions or special instructions. This will take place at [address] [address] [facility], [address].     Follow-Up: At [facility], you and your health needs are our priority.  As part of our continuing mission to provide you with exceptional heart care, we have created designated Provider Care Teams.  These Care Teams include your primary Cardiologist (physician) and Advanced Practice Providers (APPs -  Physician Assistants and Nurse Practitioners) who all work together to provide you with the care you need, when you need it.  We recommend signing up for the patient portal called "MyChart".  Sign up information is provided on this After Visit Summary.  MyChart  is used to connect with patients for Virtual Visits (Telemedicine).  Patients are able to view lab/test results, encounter notes, upcoming appointments, etc.  Non-urgent messages can be sent to your provider as well.   To learn more about what you can do with MyChart, go to [email or web address].    Your next appointment:   12 month(s)  The format for your next appointment:   In Person  Provider:   Nanetta Batty, MD

## 2022-07-11 NOTE — Progress Notes (Signed)
07/11/2022 Norman Arroyo   1951/01/01  [id number]  Primary Physician Ralene Ok, MD Primary Cardiologist: Runell Gess MD Nicholes Calamity, [place]  HPI:  Norman Arroyo is a 71 y.o.  thin-appearing married African-American male father of 3 children, grandfather of 4 grandchildren referred by Arnette Felts, PA-C for cardiac catheterization because of an abnormal Myoview stress test and nonsustained ventricular tachycardia.  I last saw him in the office 01/31/2022.  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  His risk factors include discontinue tobacco abuse 23 years ago as well as treated hyperlipidemia.  There is no family history for heart disease.  Is never had a heart attack or stroke.  Denies chest pain or shortness of breath.  He apparently had runs of nonsustained ventricular tachycardia on a 30-day event monitor although he denies palpitations.  A Myoview stress test performed 09/14/2021 showed inferior scar without ischemia and a 2D echo performed 08/25/2021 revealed normal LV function without mention of the wall motion abnormality.  He was referred for diagnostic coronary angiography.  He has seen Dr. Graciela Husbands, electrophysiology,/3/23 for his nonsustained ventricular tachycardia.  Dr. Graciela Husbands recommended low-dose beta-blocker.  I performed a coronary CTA revealing a coronary calcium score of 1198 with FFR analysis suggesting significant disease in the first diagonal branch.    Since I saw him 6 months ago he has remained stable.  He is active, denies chest pain or shortness of breath.   Current Meds  Medication Sig   ASPIRIN 81 PO Take 81 mg by mouth daily.   Cholecalciferol (VITAMIN D) 2000 UNITS tablet Take 2,000 Units by mouth at bedtime.   cyanocobalamin (,VITAMIN B-12,) 1000 MCG/ML injection Inject into the muscle every 30 (thirty) days.   ezetimibe (ZETIA) 10 MG tablet Take 10 mg by mouth daily.   metoprolol succinate (TOPROL XL) 25 MG  24 hr tablet Take 1 tablet (25 mg total) by mouth daily.   naproxen (NAPROSYN) 500 MG tablet Take 500 mg by mouth as needed for pain.   niacin 500 MG tablet Take by mouth in the morning and at bedtime.   sildenafil (VIAGRA) 25 MG tablet Take 25 mg by mouth daily as needed for erectile dysfunction.   tiZANidine (ZANAFLEX) 4 MG capsule Take 4 mg by mouth every 8 (eight) hours.      No Known Allergies  Social History   Socioeconomic History   Marital status: Married    Spouse name: Not on file   Number of children: Not on file   Years of education: Not on file   Highest education level: Not on file  Occupational History   Not on file  Tobacco Use   Smoking status: Never   Smokeless tobacco: Not on file  Substance and Sexual Activity   Alcohol use: Yes   Drug use: No   Sexual activity: Not on file  Other Topics Concern   Not on file  Social History Narrative   Not on file   Social Determinants of Health   Financial Resource Strain: Not on file  Food Insecurity: Not on file  Transportation Needs: Not on file  Physical Activity: Not on file  Stress: Not on file  Social Connections: Not on file  Intimate Partner Violence: Not on file     Review of Systems: General: negative for chills, fever, night sweats or weight changes.  Cardiovascular: negative for chest pain, dyspnea on exertion, edema, orthopnea, palpitations, paroxysmal nocturnal  dyspnea or shortness of breath Dermatological: negative for rash Respiratory: negative for cough or wheezing Urologic: negative for hematuria Abdominal: negative for nausea, vomiting, diarrhea, bright red blood per rectum, melena, or hematemesis Neurologic: negative for visual changes, syncope, or dizziness All other systems reviewed and are otherwise negative except as noted above.    Blood pressure 120/64, pulse 72, height 6\' 1"  (1.854 m), weight 216 lb 3.2 oz (98.1 kg), SpO2 92 %.  General appearance: alert and no distress Neck: no  adenopathy, no carotid bruit, no JVD, supple, symmetrical, trachea midline, and thyroid not enlarged, symmetric, no tenderness/mass/nodules Lungs: clear to auscultation bilaterally Heart: regular rate and rhythm, S1, S2 normal, no murmur, click, rub or gallop Extremities: extremities normal, atraumatic, no cyanosis or edema Pulses: 2+ and symmetric Skin: Skin color, texture, turgor normal. No rashes or lesions Neurologic: Grossly normal  EKG sinus rhythm at 72 with a nonspecific IVCD and poor R wave progression.  I personally reviewed this EKG.  ASSESSMENT AND PLAN:   HTN (hypertension) History of essential hypertension a blood pressure measured today 120/64.  He is on metoprolol.  Hyperlipidemia History of hyperlipidemia on statin therapy and Zetia with lipid profile performed 01/23/2022 revealing total cholesterol 160, LDL 105 and HDL of 38.  Nonsustained ventricular tachycardia (HCC) History of nonsustained ventricular tachycardia on beta-blocker seen by Dr. Caryl Comes in the past.  He is unaware of any palpitations or arrhythmias clinically.  Elevated coronary artery calcium score Elevated coronary calcium score of 1198.  FFR analysis revealed significant disease in a first diagonal branch but otherwise no obstructive disease.  He denies chest pain.  He is on medications for hyperlipidemia but has not achieved goal for secondary prevention yet.  We will recheck a lipid liver profile today.     Lorretta Harp MD FACP,FACC,FAHA, [facility] 07/11/2022 9:36 AM

## 2022-07-11 NOTE — Assessment & Plan Note (Signed)
Elevated coronary calcium score of 1198.  FFR analysis revealed significant disease in a first diagonal branch but otherwise no obstructive disease.  He denies chest pain.  He is on medications for hyperlipidemia but has not achieved goal for secondary prevention yet.  We will recheck a lipid liver profile today.

## 2022-07-11 NOTE — Assessment & Plan Note (Signed)
History of nonsustained ventricular tachycardia on beta-blocker seen by Dr. Graciela Husbands in the past.  He is unaware of any palpitations or arrhythmias clinically.

## 2022-07-11 NOTE — Assessment & Plan Note (Signed)
History of hyperlipidemia on statin therapy and Zetia with lipid profile performed 01/23/2022 revealing total cholesterol 160, LDL 105 and HDL of 38.

## 2022-07-20 ENCOUNTER — Ambulatory Visit (HOSPITAL_COMMUNITY)
Admission: RE | Admit: 2022-07-20 | Discharge: 2022-07-20 | Disposition: A | Discharge status: Home / Self Care | Payer: Medicare HMO | Source: Ambulatory Visit | Attending: Cardiovascular Disease | Admitting: Cardiovascular Disease

## 2022-07-20 DIAGNOSIS — I10 Essential (primary) hypertension: Secondary | ICD-10-CM | POA: Insufficient documentation

## 2022-07-20 DIAGNOSIS — E782 Mixed hyperlipidemia: Secondary | ICD-10-CM | POA: Insufficient documentation

## 2022-07-24 ENCOUNTER — Ambulatory Visit: Payer: Medicare HMO | Attending: Cardiology | Admitting: Student

## 2022-07-24 ENCOUNTER — Telehealth: Payer: Self-pay | Admitting: Pharmacist

## 2022-07-24 DIAGNOSIS — E7849 Other hyperlipidemia: Secondary | ICD-10-CM | POA: Diagnosis not present

## 2022-07-24 DIAGNOSIS — E782 Mixed hyperlipidemia: Secondary | ICD-10-CM

## 2022-07-24 MED ORDER — REPATHA SURECLICK 140 MG/ML ~~LOC~~ SOAJ: 140.0000 mg | SUBCUTANEOUS | 3 refills | Status: DC

## 2022-07-24 NOTE — Addendum Note (Signed)
Addended by: Tylene Fantasia on: 07/24/2022 12:59 PM   Modules accepted: Orders

## 2022-07-24 NOTE — Progress Notes (Signed)
Patient ID: Murvel Wissman                 DOB: 09/24/1950                    MRN: [id number]      HPI: Norman Arroyo is a 71 y.o. male patient referred to lipid clinic by [facility]. PMH is significant for hypertension, elevated coronary calcium score, HDL.   Today patient has no acute concern.We reviewed the risk of elevated LDLc, elevated CAC score and importance of achieving goal LDLc to reduce risk of MI and stroke. Reviewed options for lowering LDL cholesterol, including statins ezetimibe, PCSK-9 inhibitors, bempedoic acid and inclisiran.  Discussed mechanisms of action, dosing, side effects and potential decreases in LDL cholesterol.  Also reviewed cost information and potential options for patient assistance. Patient state he can not tolerates statins well - get sever muscle and joint pain. Currently on niacin and ezetimibe - take them regularly and tolerates them well.   Diet: eat pretty good, limit carbs, fat, does not eat lot of salt. Weight is from 245 to 216 lbs in 6 moths   Exercise: walks on treadmill, weight 3-4 times per week  Family History: sister and father died of heart attack.   Social History:  Smoking-  quit 23 years ago Alcohol - holiday season only and in limitation   Labs: Lipid Panel     Component Value Date/Time   CHOL 162 07/11/2022 0951   TRIG 107 07/11/2022 0951   HDL 42 07/11/2022 0951   CHOLHDL 3.9 07/11/2022 0951   CHOLHDL 3.1 08/24/2013 0500   VLDL 27 08/24/2013 0500   LDLCALC 100 (H) 07/11/2022 0951   LABVLDL 20 07/11/2022 0951    Past Medical History:  Diagnosis Date   Achilles bursitis or tendinitis    Calcaneal spur    Corns and callosities    Disturbance of skin sensation    Encounter for long-term (current) use of other medications    Foot and toe(s), insect bite, nonvenomous, without mention of infection    Hypertension    Lumbago    Nonspecific abnormal electrocardiogram (ECG) (EKG)    Other abnormal glucose 272.4   Other and  unspecified hyperlipidemia    Pain in joint, pelvic region and thigh    Sprain of lumbar region    Sprain of thoracic region     Current Outpatient Medications on File Prior to Visit  Medication Sig Dispense Refill   ASPIRIN 81 PO Take 81 mg by mouth daily.     Cholecalciferol (VITAMIN D) 2000 UNITS tablet Take 2,000 Units by mouth at bedtime.     cyanocobalamin (,VITAMIN B-12,) 1000 MCG/ML injection Inject into the muscle every 30 (thirty) days.     ezetimibe (ZETIA) 10 MG tablet Take 10 mg by mouth daily.     metoprolol succinate (TOPROL XL) 25 MG 24 hr tablet Take 1 tablet (25 mg total) by mouth daily. 90 tablet 3   naproxen (NAPROSYN) 500 MG tablet Take 500 mg by mouth as needed for pain.     niacin 500 MG tablet Take by mouth in the morning and at bedtime.     sildenafil (VIAGRA) 25 MG tablet Take 25 mg by mouth daily as needed for erectile dysfunction.     tiZANidine (ZANAFLEX) 4 MG capsule Take 4 mg by mouth every 8 (eight) hours.      No current facility-administered medications on file prior to visit.    No  Known Allergies   Problem  Hyperlipidemia   Current Medications: Niacin 500 mg twice daily, Ezetimibe 10 mg daily  Intolerances: rosuvastatin  does not recall the strength, Simvastatin 20 mg- Severe Myalgias Risk Factors: Elevated coronary calcium score of 1198. FFR analysis revealed significant disease in a first diagonal branch, elevated LDL LDL goal: <70 mg/dl Last LDLc: 100 (Dec 2023)    Hyperlipidemia Assessment:  LDL goal: <70 mg/dl last LDLc 100 mg/dl (07/11/2022) Takes  Zetia and niacin regularly and tolerates them well without any side effects  Intolerance to statins (rosuvastatin and simvastatin- severe  muscle pain/cramps affects the mobility Discussed next potential options (PCSK-9 inhibitors, bempedoic acid and inclisiran); cost, dosing efficacy, side effects   Plan: Continue taking current medications (Niacin 500 mg twice daily, Ezetimibe 10 mg  daily) Will apply for PA for PCSK9i; will inform patient upon approval (prefers MyChart message) Lipid lab due in 2-3 months after starting PCSK9i   Thank you,  Cammy Copa, Pharm.D [facility] of [facility] [address] [place], [place] [address]  Phone: [phone]; Fax: [phone]

## 2022-07-24 NOTE — Assessment & Plan Note (Signed)
Assessment:  LDL goal: <70 mg/dl last LDLc 614 mg/dl (43/15/4008) Takes  Zetia and niacin regularly and tolerates them well without any side effects  Intolerance to statins (rosuvastatin and simvastatin- severe  muscle pain/cramps affects the mobility Discussed next potential options (PCSK-9 inhibitors, bempedoic acid and inclisiran); cost, dosing efficacy, side effects   Plan: Continue taking current medications (Niacin 500 mg twice daily, Ezetimibe 10 mg daily) Will apply for PA for PCSK9i; will inform patient upon approval (prefers MyChart message) Lipid lab due in 2-3 months after starting [facility]

## 2022-07-24 NOTE — Telephone Encounter (Addendum)
Repatha PA  (Key: [id number])  PA approved. Coverage Ends on: 08/06/2023  Enrolled in the [facility]   CARD NO. [id number]   CARD STATUS Active   BIN [id number]   PCN [id number]   PC GROUP [id number]

## 2023-06-10 ENCOUNTER — Other Ambulatory Visit: Payer: Self-pay | Admitting: Cardiovascular Disease

## 2023-07-22 ENCOUNTER — Encounter: Payer: Self-pay | Admitting: Cardiovascular Disease

## 2023-07-22 ENCOUNTER — Ambulatory Visit: Payer: Medicare HMO | Attending: Cardiovascular Disease | Admitting: Cardiovascular Disease

## 2023-07-22 VITALS — BP 130/78 | HR 68 | Ht 73.0 in | Wt 233.0 lb

## 2023-07-22 DIAGNOSIS — I4729 Other ventricular tachycardia: Secondary | ICD-10-CM | POA: Diagnosis not present

## 2023-07-22 DIAGNOSIS — R931 Abnormal findings on diagnostic imaging of heart and coronary circulation: Secondary | ICD-10-CM

## 2023-07-22 DIAGNOSIS — R079 Chest pain, unspecified: Secondary | ICD-10-CM

## 2023-07-22 DIAGNOSIS — E782 Mixed hyperlipidemia: Secondary | ICD-10-CM

## 2023-07-22 DIAGNOSIS — I10 Essential (primary) hypertension: Secondary | ICD-10-CM

## 2023-07-22 MED ORDER — REPATHA SURECLICK 140 MG/ML ~~LOC~~ SOAJ: 140.0000 mg | SUBCUTANEOUS | 3 refills | Status: DC

## 2023-07-22 NOTE — Patient Instructions (Signed)

## 2023-07-22 NOTE — Assessment & Plan Note (Signed)
Patient hyperlipidemia on Zetia and Repatha with lipid profile performed/9/24 revealing total cholesterol 109, LDL 50 and HDL 40.

## 2023-07-22 NOTE — Assessment & Plan Note (Signed)
History of essential hypertension her blood pressure measured today at 138/78.  He is on no antihypertensive medications.

## 2023-07-22 NOTE — Progress Notes (Signed)
07/22/2023 Norman Arroyo   1950-08-26  [id number]  Primary Physician Ralene Ok, MD Primary Cardiologist: Runell Gess MD Nicholes Calamity, [place]  HPI:  Norman Arroyo is a 72 y.o.  thin-appearing married African-American male father of 3 children, grandfather of 4 grandchildren referred by Arnette Felts, PA-C for cardiac catheterization because of an abnormal Myoview stress test and nonsustained ventricular tachycardia.  I last saw him in the office 07/11/2022.  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  His risk factors include discontinue tobacco abuse 23 years ago as well as treated hyperlipidemia.  There is no family history for heart disease.  Is never had a heart attack or stroke.  Denies chest pain or shortness of breath.  He apparently had runs of nonsustained ventricular tachycardia on a 30-day event monitor although he denies palpitations.  A Myoview stress test performed 09/14/2021 showed inferior scar without ischemia and a 2D echo performed 08/25/2021 revealed normal LV function without mention of the wall motion abnormality.  He was referred for diagnostic coronary angiography.  He has seen Dr. Graciela Husbands, electrophysiology,/3/23 for his nonsustained ventricular tachycardia.  Dr. Graciela Husbands recommended low-dose beta-blocker.  I performed a coronary CTA revealing a coronary calcium score of 1198 with FFR analysis suggesting significant disease in the first diagonal branch.     Since I saw him in the office a year ago he continues to do well.  He is fairly active and denies chest pain or shortness of breath.  He does complain of some sciatica type symptoms however.   Current Meds  Medication Sig   ASPIRIN 81 PO Take 81 mg by mouth daily.   Cholecalciferol (VITAMIN D) 2000 UNITS tablet Take 2,000 Units by mouth at bedtime.   cyanocobalamin (,VITAMIN B-12,) 1000 MCG/ML injection Inject into the muscle every 30 (thirty) days.   ezetimibe (ZETIA)  10 MG tablet Take 10 mg by mouth daily.   naproxen (NAPROSYN) 500 MG tablet Take 500 mg by mouth as needed for pain.   sildenafil (VIAGRA) 25 MG tablet Take 25 mg by mouth daily as needed for erectile dysfunction.   [DISCONTINUED] metoprolol succinate (TOPROL XL) 25 MG 24 hr tablet Take 1 tablet (25 mg total) by mouth daily.   [DISCONTINUED] niacin 500 MG tablet Take by mouth in the morning and at bedtime.   [DISCONTINUED] REPATHA SURECLICK 140 MG/ML SOAJ INJECT 140MG  INTO THE SKIN EVERY 14 DAYS   [DISCONTINUED] tiZANidine (ZANAFLEX) 4 MG capsule Take 4 mg by mouth every 8 (eight) hours.      No Known Allergies  Social History   Socioeconomic History   Marital status: Married    Spouse name: Not on file   Number of children: Not on file   Years of education: Not on file   Highest education level: Not on file  Occupational History   Not on file  Tobacco Use   Smoking status: Never   Smokeless tobacco: Not on file  Substance and Sexual Activity   Alcohol use: Yes   Drug use: No   Sexual activity: Not on file  Other Topics Concern   Not on file  Social History Narrative   Not on file   Social Drivers of Health   Financial Resource Strain: Not on file  Food Insecurity: Low Risk  (03/08/2023)   Received from [facility]   Hunger Vital Sign    Worried About Running Out of Food in the Last Year:  Never true    Ran Out of Food in the Last Year: Never true  Transportation Needs: No Transportation Needs (03/08/2023)   Received from [facility]    In the past 12 months, has lack of reliable transportation kept you from medical appointments, meetings, work or from getting things needed for daily living? : No  Physical Activity: Not on file  Stress: No Stress Concern Present (05/26/2020)   Received from [facility], [facility] - Occupational Stress Questionnaire    Feeling of Stress : Not at all  Social Connections:  Unknown (12/17/2021)   Received from [facility], [facility]   Social Network    Social Network: Not on file  Intimate Partner Violence: Unknown (11/07/2021)   [facility] from [facility], [facility]   HITS    Physically Hurt: Not on file    Insult or Talk Down To: Not on file    Threaten Physical Harm: Not on file    Scream or Curse: Not on file     Review of Systems: General: negative for chills, fever, night sweats or weight changes.  Cardiovascular: negative for chest pain, dyspnea on exertion, edema, orthopnea, palpitations, paroxysmal nocturnal dyspnea or shortness of breath Dermatological: negative for rash Respiratory: negative for cough or wheezing Urologic: negative for hematuria Abdominal: negative for nausea, vomiting, diarrhea, bright red blood per rectum, melena, or hematemesis Neurologic: negative for visual changes, syncope, or dizziness All other systems reviewed and are otherwise negative except as noted above.    Blood pressure 130/78, pulse 68, height 6\' 1"  (1.854 m), weight 233 lb (105.7 kg).  General appearance: alert and no distress Neck: no adenopathy, no carotid bruit, no JVD, supple, symmetrical, trachea midline, and thyroid not enlarged, symmetric, no tenderness/mass/nodules Lungs: clear to auscultation bilaterally Heart: regular rate and rhythm, S1, S2 normal, no murmur, click, rub or gallop Extremities: extremities normal, atraumatic, no cyanosis or edema Pulses: 2+ and symmetric Skin: Skin color, texture, turgor normal. No rashes or lesions Neurologic: Grossly normal  EKG EKG Interpretation Date/Time:  Monday July 22 2023 13:31:33 EST Ventricular Rate:  68 PR Interval:  134 QRS Duration:  132 QT Interval:  400 QTC Calculation: 425 R Axis:   20  Text Interpretation: Sinus rhythm with Premature supraventricular complexes Non-specific intra-ventricular conduction block Cannot rule out Anterior infarct , age undetermined When compared  with ECG of 18-Jul-2021 17:23, PREVIOUS ECG IS PRESENT Confirmed by Nanetta Batty [id number]) on 07/22/2023 1:39:23 PM    ASSESSMENT AND PLAN:   HTN (hypertension) History of essential hypertension her blood pressure measured today at 138/78.  He is on no antihypertensive medications.  Hyperlipidemia Patient hyperlipidemia on Zetia and Repatha with lipid profile performed/9/24 revealing total cholesterol 109, LDL 50 and HDL 40.  Elevated coronary artery calcium score History of elevated coronary calcium score of 1198 with FFR analysis suggesting significant disease in the first diagonal branch.  He is completely asymptomatic.     Runell Gess MD FACP,FACC,FAHA, [facility] 07/22/2023 1:46 PM

## 2023-07-22 NOTE — Assessment & Plan Note (Signed)
History of elevated coronary calcium score of 1198 with FFR analysis suggesting significant disease in the first diagonal branch.  He is completely asymptomatic.

## 2024-07-20 ENCOUNTER — Ambulatory Visit: Attending: Cardiovascular Disease | Admitting: Cardiovascular Disease

## 2024-07-20 ENCOUNTER — Encounter: Payer: Self-pay | Admitting: Cardiovascular Disease

## 2024-07-20 VITALS — BP 134/72 | HR 68 | Ht 73.0 in | Wt 228.0 lb

## 2024-07-20 DIAGNOSIS — I10 Essential (primary) hypertension: Secondary | ICD-10-CM

## 2024-07-20 DIAGNOSIS — E782 Mixed hyperlipidemia: Secondary | ICD-10-CM | POA: Diagnosis not present

## 2024-07-20 DIAGNOSIS — I4729 Other ventricular tachycardia: Secondary | ICD-10-CM

## 2024-07-20 DIAGNOSIS — R931 Abnormal findings on diagnostic imaging of heart and coronary circulation: Secondary | ICD-10-CM | POA: Diagnosis not present

## 2024-07-20 NOTE — Assessment & Plan Note (Signed)
 History of essential hypertension with blood pressure measured today at 134/72.  He is not on antihypertensive medications currently.

## 2024-07-20 NOTE — Assessment & Plan Note (Signed)
 History of hyperlipidemia intolerant to statin therapy with recent lipid profile performed by his PCP 6 months ago revealed a total cholesterol of 110, LDL of 51 and HDL of 39, acceptable for secondary prevention given his elevated coronary calcium score.

## 2024-07-20 NOTE — Progress Notes (Signed)
 07/20/2024 Norman Arroyo   08/11/1950  [id number]  Primary Physician Valma Carwin, MD Primary Cardiologist: Dorn JINNY Lesches MD [name], [place]  HPI:  Norman Arroyo is a 73 y.o.    thin-appearing married African-American male father of 3 children, grandfather of 4 grandchildren referred by Garrel Kubas, PA-C for cardiac catheterization because of an abnormal Myoview stress test and nonsustained ventricular tachycardia.  I last saw him in the office 07/22/2023.  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  His risk factors include discontinue tobacco abuse 23 years ago as well as treated hyperlipidemia.  There is no family history for heart disease.  Is never had a heart attack or stroke.  Denies chest pain or shortness of breath.  He apparently had runs of nonsustained ventricular tachycardia on a 30-day event monitor although he denies palpitations.  A Myoview stress test performed 09/14/2021 showed inferior scar without ischemia and a 2D echo performed 08/25/2021 revealed normal LV function without mention of the wall motion abnormality.  He was referred for diagnostic coronary angiography.  He has seen Dr. Fernande, electrophysiology,/3/23 for his nonsustained ventricular tachycardia.  Dr. Fernande recommended low-dose beta-blocker.  I performed a coronary CTA revealing a coronary calcium score of 1198 with FFR analysis suggesting significant disease in the first diagonal branch.     Since I saw him in the office a year ago he continues to do well.  He is fairly active and denies chest pain or shortness of breath.   Active Medications[1]   Allergies[2]  Social History   Socioeconomic History   Marital status: Married    Spouse name: Not on file   Number of children: Not on file   Years of education: Not on file   Highest education level: Not on file  Occupational History   Not on file  Tobacco Use   Smoking status: Never   Smokeless  tobacco: Not on file  Substance and Sexual Activity   Alcohol use: Yes   Drug use: No   Sexual activity: Not on file  Other Topics Concern   Not on file  Social History Narrative   Not on file   Social Drivers of Health   Tobacco Use: Unknown (07/20/2024)   Patient History    Smoking Tobacco Use: Never    Smokeless Tobacco Use: Unknown    Passive Exposure: Not on file  Financial Resource Strain: Not on file  Food Insecurity: Low Risk (03/08/2023)   Received from [facility]   Epic    Within the past 12 months, you worried that your food would run out before you got money to buy more: Never true    Within the past 12 months, the food you bought just didn't last and you didn't have money to get more. : Never true  Transportation Needs: No Transportation Needs (03/08/2023)   Received from [facility]    In the past 12 months, has lack of reliable transportation kept you from medical appointments, meetings, work or from getting things needed for daily living? : No  Physical Activity: Not on file  Stress: Not on file  Social Connections: Not on file  Intimate Partner Violence: Not on file  Depression (EYV7-0): Not on file  Alcohol Screen: Not on file  Housing: Low Risk (03/08/2023)   Received from [facility]   Epic    What is your living situation today?: I have a steady  place to live    Think about the place you live. Do you have problems with any of the following? Choose all that apply:: None/None on this list  Utilities: Low Risk (03/08/2023)   Received from [facility]   Utilities    In the past 12 months has the electric, gas, oil, or water company threatened to shut off services in your home? : No  Health Literacy: Not on file     Review of Systems: General: negative for chills, fever, night sweats or weight changes.  Cardiovascular: negative for chest pain, dyspnea on exertion, edema, orthopnea, palpitations, paroxysmal nocturnal dyspnea or shortness  of breath Dermatological: negative for rash Respiratory: negative for cough or wheezing Urologic: negative for hematuria Abdominal: negative for nausea, vomiting, diarrhea, bright red blood per rectum, melena, or hematemesis Neurologic: negative for visual changes, syncope, or dizziness All other systems reviewed and are otherwise negative except as noted above.    Blood pressure 134/72, pulse 68, height 6' 1 (1.854 m), weight 228 lb (103.4 kg), SpO2 94%.  General appearance: alert and no distress Neck: no adenopathy, no carotid bruit, no JVD, supple, symmetrical, trachea midline, and thyroid not enlarged, symmetric, no tenderness/mass/nodules Lungs: clear to auscultation bilaterally Heart: regular rate and rhythm, S1, S2 normal, no murmur, click, rub or gallop Extremities: extremities normal, atraumatic, no cyanosis or edema Pulses: 2+ and symmetric Skin: Skin color, texture, turgor normal. No rashes or lesions Neurologic: Grossly normal  EKG EKG Interpretation Date/Time:  Monday July 20 2024 08:09:21 EST Ventricular Rate:  68 PR Interval:  144 QRS Duration:  138 QT Interval:  392 QTC Calculation: 416 R Axis:   9  Text Interpretation: Normal sinus rhythm Non-specific intra-ventricular conduction block Minimal voltage criteria for LVH, may be normal variant ( Cornell product ) When compared with ECG of 22-Jul-2023 13:31, Premature supraventricular complexes are no longer Present Confirmed by Court Carrier [phone]) on 07/20/2024 8:13:32 AM    ASSESSMENT AND PLAN:   HTN (hypertension) History of essential hypertension with blood pressure measured today at 134/72.  He is not on antihypertensive medications currently.  Hyperlipidemia History of hyperlipidemia intolerant to statin therapy with recent lipid profile performed by his PCP 6 months ago revealed a total cholesterol of 110, LDL of 51 and HDL of 39, acceptable for secondary prevention given his elevated coronary  calcium score.  Nonsustained ventricular tachycardia (HCC) History of nonsustained ventricular tachycardia having been evaluated by Dr. Fernande from electrophysiology/3/23.  He did recommend low-dose beta-blocker.  He subsequently has decreased his high energy drinks and caffeine intake and no longer is symptomatic.  Elevated coronary artery calcium score History of elevated coronary calcium score of 1198 however FFR analysis only showed significance in a diagonal branch.  He is completely asymptomatic and at goal for secondary prevention.  He has had a normal 2D echo back in 2023 and a Myoview stress test that showed inferior scar without ischemia.     Carrier DOROTHA Court MD FACP,FACC,FAHA, [facility] 07/20/2024 8:23 AM    [1]  Current Meds  Medication Sig   ASPIRIN  81 PO Take 81 mg by mouth daily.   Cholecalciferol  (VITAMIN D ) 2000 UNITS tablet Take 2,000 Units by mouth at bedtime.   cyanocobalamin (,VITAMIN B-12,) 1000 MCG/ML injection Inject into the muscle every 30 (thirty) days.   Evolocumab  (REPATHA  SURECLICK) 140 MG/ML SOAJ Inject 140 mg as directed every 14 (fourteen) days.   ezetimibe (ZETIA) 10 MG tablet Take 10 mg by mouth daily.  sildenafil (VIAGRA) 25 MG tablet Take 25 mg by mouth daily as needed for erectile dysfunction.  [2] No Known Allergies

## 2024-07-20 NOTE — Assessment & Plan Note (Signed)
 History of nonsustained ventricular tachycardia having been evaluated by Dr. Fernande from electrophysiology/3/23.  He did recommend low-dose beta-blocker.  He subsequently has decreased his high energy drinks and caffeine intake and no longer is symptomatic.

## 2024-07-20 NOTE — Assessment & Plan Note (Signed)
 History of elevated coronary calcium score of 1198 however FFR analysis only showed significance in a diagonal branch.  He is completely asymptomatic and at goal for secondary prevention.  He has had a normal 2D echo back in 2023 and a Myoview stress test that showed inferior scar without ischemia.

## 2024-07-20 NOTE — Patient Instructions (Signed)

## 2024-08-08 ENCOUNTER — Other Ambulatory Visit: Payer: Self-pay | Admitting: Cardiovascular Disease
